# Patient Record
Sex: Female | Born: 1945
Health system: Southern US, Community
[De-identification: ages and names within clinical notes are randomized; demographics above are authoritative.]

## PROBLEM LIST (undated history)

## (undated) HISTORY — PX: BREAST LUMPECTOMY: SHX2

---

## 2015-12-21 DIAGNOSIS — H9193 Unspecified hearing loss, bilateral: Secondary | ICD-10-CM | POA: Diagnosis not present

## 2015-12-21 DIAGNOSIS — E119 Type 2 diabetes mellitus without complications: Secondary | ICD-10-CM | POA: Diagnosis not present

## 2015-12-21 DIAGNOSIS — R739 Hyperglycemia, unspecified: Secondary | ICD-10-CM | POA: Diagnosis not present

## 2015-12-21 DIAGNOSIS — J439 Emphysema, unspecified: Secondary | ICD-10-CM | POA: Diagnosis not present

## 2016-06-22 DIAGNOSIS — Z87891 Personal history of nicotine dependence: Secondary | ICD-10-CM | POA: Diagnosis not present

## 2016-06-22 DIAGNOSIS — E782 Mixed hyperlipidemia: Secondary | ICD-10-CM | POA: Diagnosis not present

## 2016-06-22 DIAGNOSIS — J418 Mixed simple and mucopurulent chronic bronchitis: Secondary | ICD-10-CM | POA: Diagnosis not present

## 2016-06-22 DIAGNOSIS — Z0000 Encounter for general adult medical examination without abnormal findings: Secondary | ICD-10-CM | POA: Diagnosis not present

## 2016-08-28 DIAGNOSIS — Z87891 Personal history of nicotine dependence: Secondary | ICD-10-CM | POA: Diagnosis not present

## 2016-08-28 DIAGNOSIS — Z122 Encounter for screening for malignant neoplasm of respiratory organs: Secondary | ICD-10-CM | POA: Diagnosis not present

## 2016-08-28 DIAGNOSIS — Z1231 Encounter for screening mammogram for malignant neoplasm of breast: Secondary | ICD-10-CM | POA: Diagnosis not present

## 2016-09-12 DIAGNOSIS — Z23 Encounter for immunization: Secondary | ICD-10-CM | POA: Diagnosis not present

## 2017-05-09 DIAGNOSIS — R000 Tachycardia, unspecified: Secondary | ICD-10-CM | POA: Diagnosis not present

## 2017-05-09 DIAGNOSIS — J441 Chronic obstructive pulmonary disease with (acute) exacerbation: Secondary | ICD-10-CM | POA: Diagnosis not present

## 2017-05-09 DIAGNOSIS — I700 Atherosclerosis of aorta: Secondary | ICD-10-CM | POA: Diagnosis not present

## 2017-05-09 DIAGNOSIS — J029 Acute pharyngitis, unspecified: Secondary | ICD-10-CM | POA: Diagnosis not present

## 2017-05-09 DIAGNOSIS — R0902 Hypoxemia: Secondary | ICD-10-CM | POA: Diagnosis not present

## 2017-05-09 DIAGNOSIS — R079 Chest pain, unspecified: Secondary | ICD-10-CM | POA: Diagnosis not present

## 2017-05-09 DIAGNOSIS — I2510 Atherosclerotic heart disease of native coronary artery without angina pectoris: Secondary | ICD-10-CM | POA: Diagnosis not present

## 2017-05-09 DIAGNOSIS — K449 Diaphragmatic hernia without obstruction or gangrene: Secondary | ICD-10-CM | POA: Diagnosis not present

## 2017-07-16 DIAGNOSIS — Z87891 Personal history of nicotine dependence: Secondary | ICD-10-CM | POA: Diagnosis not present

## 2017-07-16 DIAGNOSIS — J439 Emphysema, unspecified: Secondary | ICD-10-CM | POA: Diagnosis not present

## 2017-07-16 DIAGNOSIS — N951 Menopausal and female climacteric states: Secondary | ICD-10-CM | POA: Diagnosis not present

## 2017-07-16 DIAGNOSIS — R42 Dizziness and giddiness: Secondary | ICD-10-CM | POA: Diagnosis not present

## 2017-07-24 ENCOUNTER — Telehealth: Payer: Self-pay | Admitting: Acute Care

## 2017-07-24 DIAGNOSIS — Z122 Encounter for screening for malignant neoplasm of respiratory organs: Secondary | ICD-10-CM

## 2017-07-24 DIAGNOSIS — Z87891 Personal history of nicotine dependence: Secondary | ICD-10-CM

## 2017-07-26 NOTE — Telephone Encounter (Signed)
Will forward to lung nodule pool.  

## 2017-07-27 NOTE — Telephone Encounter (Signed)
Spoke with pt and scheduled for Alyssa Petty 09/05/17 10:00 CT ordered Pt verbalized understanding Nothing further needed

## 2017-08-07 ENCOUNTER — Other Ambulatory Visit: Payer: Self-pay | Admitting: Family Medicine

## 2017-08-07 DIAGNOSIS — Z1231 Encounter for screening mammogram for malignant neoplasm of breast: Secondary | ICD-10-CM

## 2017-08-24 DIAGNOSIS — Z23 Encounter for immunization: Secondary | ICD-10-CM | POA: Diagnosis not present

## 2017-09-04 ENCOUNTER — Ambulatory Visit
Admission: RE | Admit: 2017-09-04 | Discharge: 2017-09-04 | Disposition: A | Discharge status: Home / Self Care | Payer: Medicare Other | Source: Ambulatory Visit | Attending: Family Medicine | Admitting: Family Medicine

## 2017-09-04 DIAGNOSIS — Z1231 Encounter for screening mammogram for malignant neoplasm of breast: Secondary | ICD-10-CM

## 2017-09-05 ENCOUNTER — Encounter: Payer: Self-pay | Admitting: Acute Care

## 2017-09-05 ENCOUNTER — Ambulatory Visit (INDEPENDENT_AMBULATORY_CARE_PROVIDER_SITE_OTHER)
Admission: RE | Admit: 2017-09-05 | Discharge: 2017-09-05 | Disposition: A | Discharge status: Home / Self Care | Payer: Medicare Other | Source: Ambulatory Visit | Attending: Acute Care | Admitting: Acute Care

## 2017-09-05 ENCOUNTER — Ambulatory Visit (INDEPENDENT_AMBULATORY_CARE_PROVIDER_SITE_OTHER): Payer: Medicare Other | Admitting: Acute Care

## 2017-09-05 DIAGNOSIS — Z122 Encounter for screening for malignant neoplasm of respiratory organs: Secondary | ICD-10-CM

## 2017-09-05 DIAGNOSIS — Z87891 Personal history of nicotine dependence: Secondary | ICD-10-CM

## 2017-09-05 NOTE — Progress Notes (Signed)
Shared Decision Making Visit Lung Cancer Screening Program [phone])   Eligibility:  Age 71 y.o.  Pack Years Smoking History Calculation 114-pack-year smoking history (# packs/per year x # years smoked)  Recent History of coughing up blood  no  Unexplained weight loss? no ( >Than 15 pounds within the last 6 months )  Prior History Lung / other cancer no (Diagnosis within the last 5 years already requiring surveillance chest CT Scans).  Smoking Status Former Smoker  Former Smokers: Years since quit: 14 years  Quit Date: 2004  Visit Components:  Discussion included one or more decision making aids. yes  Discussion included risk/benefits of screening. yes  Discussion included potential follow up diagnostic testing for abnormal scans. yes  Discussion included meaning and risk of over diagnosis. yes  Discussion included meaning and risk of False Positives. yes  Discussion included meaning of total radiation exposure. yes  Counseling Included:  Importance of adherence to annual lung cancer LDCT screening. yes  Impact of comorbidities on ability to participate in the program. yes  Ability and willingness to under diagnostic treatment. yes  Smoking Cessation Counseling:  Current Smokers:   Discussed importance of smoking cessation. Not applicable patient is a former smoker  Information about tobacco cessation classes and interventions provided to patient. yes  Patient provided with "ticket" for LDCT Scan. yes  Symptomatic Patient. no  Counseling  Diagnosis Code: Tobacco Use Z72.0  Asymptomatic Patient yes  Counseling (Intermediate counseling: > three minutes counseling) [id number]  Former Smokers:   Discussed the importance of maintaining cigarette abstinence. yes  Diagnosis Code: Personal History of Nicotine Dependence. W29.562  Information about tobacco cessation classes and interventions provided to patient. Yes  Patient provided with "ticket" for LDCT  Scan. yes  Written Order for Lung Cancer Screening with LDCT placed in Epic. Yes (CT Chest Lung Cancer Screening Low Dose W/O CM) [id number] Z12.2-Screening of respiratory organs Z87.891-Personal history of nicotine dependence  I spent 25 minutes of face to face time with Alyssa Petty discussing the risks and benefits of lung cancer screening. We viewed a power point together that explained in detail the above noted topics. We took the time to pause the power point at intervals to allow for questions to be asked and answered to ensure understanding. We discussed that she had taken the single most powerful action possible to decrease her risk of developing lung cancer when she quit smoking. I counseled her to remain smoke free, and to contact me if she ever had the desire to smoke again so that I can provide resources and tools to help support the effort to remain smoke free. We discussed the time and location of the scan, and that either  Alyssa Miyamoto RN or I will call with the results within  24-48 hours of receiving them. She has my card and contact information in the event she needs to speak with me, in addition to a copy of the power point we reviewed as a resource. Alyssa Petty verbalized understanding of all of the above and had no further questions upon leaving the office.     I explained to the patient that there has been a high incidence of coronary artery disease noted on these exams. I explained that this is a non-gated exam therefore degree or severity cannot be determined. This patient is not on statin therapy. I have asked the patient to follow-up with their PCP regarding any incidental finding of coronary artery disease and management with diet or medication  as they feel is clinically indicated. The patient verbalized understanding of the above and had no further questions.     Bevelyn Ngo, NP 09/05/2017

## 2017-09-14 ENCOUNTER — Telehealth: Payer: Self-pay | Admitting: Acute Care

## 2017-09-14 DIAGNOSIS — Z87891 Personal history of nicotine dependence: Secondary | ICD-10-CM

## 2017-09-14 DIAGNOSIS — Z122 Encounter for screening for malignant neoplasm of respiratory organs: Secondary | ICD-10-CM

## 2017-09-14 NOTE — Telephone Encounter (Signed)
Pt informed of CT results per Sarah Groce, NP.  PT verbalized understanding.  Copy sent to PCP.  Order placed for 1 yr f/u CT.  

## 2017-09-24 DIAGNOSIS — Z780 Asymptomatic menopausal state: Secondary | ICD-10-CM | POA: Diagnosis not present

## 2018-01-01 DIAGNOSIS — Z0001 Encounter for general adult medical examination with abnormal findings: Secondary | ICD-10-CM | POA: Diagnosis not present

## 2018-01-01 DIAGNOSIS — R0989 Other specified symptoms and signs involving the circulatory and respiratory systems: Secondary | ICD-10-CM | POA: Diagnosis not present

## 2018-01-01 DIAGNOSIS — Z79899 Other long term (current) drug therapy: Secondary | ICD-10-CM | POA: Diagnosis not present

## 2018-01-01 DIAGNOSIS — J439 Emphysema, unspecified: Secondary | ICD-10-CM | POA: Diagnosis not present

## 2018-01-01 DIAGNOSIS — I700 Atherosclerosis of aorta: Secondary | ICD-10-CM | POA: Diagnosis not present

## 2018-08-23 ENCOUNTER — Other Ambulatory Visit: Payer: Self-pay | Admitting: Family Medicine

## 2018-08-23 DIAGNOSIS — Z1231 Encounter for screening mammogram for malignant neoplasm of breast: Secondary | ICD-10-CM

## 2018-08-27 DIAGNOSIS — H524 Presbyopia: Secondary | ICD-10-CM | POA: Diagnosis not present

## 2018-08-27 DIAGNOSIS — H353133 Nonexudative age-related macular degeneration, bilateral, advanced atrophic without subfoveal involvement: Secondary | ICD-10-CM | POA: Diagnosis not present

## 2018-09-02 DIAGNOSIS — Z23 Encounter for immunization: Secondary | ICD-10-CM | POA: Diagnosis not present

## 2018-09-16 ENCOUNTER — Ambulatory Visit (INDEPENDENT_AMBULATORY_CARE_PROVIDER_SITE_OTHER)
Admission: RE | Admit: 2018-09-16 | Discharge: 2018-09-16 | Disposition: A | Discharge status: Home / Self Care | Payer: Medicare Other | Source: Ambulatory Visit | Attending: Acute Care | Admitting: Acute Care

## 2018-09-16 DIAGNOSIS — Z87891 Personal history of nicotine dependence: Secondary | ICD-10-CM | POA: Diagnosis not present

## 2018-09-16 DIAGNOSIS — Z122 Encounter for screening for malignant neoplasm of respiratory organs: Secondary | ICD-10-CM

## 2018-09-20 NOTE — Progress Notes (Signed)
Called spoke with patient, advised of LDCT results / recs as stated by Maralyn Sago NP.  Pt verbalized understanding and denied any questions.  LDCT results faxed to PCP.  Denise to order LDCT for 09/2019.

## 2018-09-23 ENCOUNTER — Ambulatory Visit
Admission: RE | Admit: 2018-09-23 | Discharge: 2018-09-23 | Disposition: A | Discharge status: Home / Self Care | Payer: Medicare Other | Source: Ambulatory Visit | Attending: Family Medicine | Admitting: Family Medicine

## 2018-09-23 DIAGNOSIS — Z1231 Encounter for screening mammogram for malignant neoplasm of breast: Secondary | ICD-10-CM | POA: Diagnosis not present

## 2018-09-24 ENCOUNTER — Other Ambulatory Visit: Payer: Self-pay | Admitting: Acute Care

## 2018-09-24 DIAGNOSIS — Z87891 Personal history of nicotine dependence: Secondary | ICD-10-CM

## 2018-09-24 DIAGNOSIS — Z122 Encounter for screening for malignant neoplasm of respiratory organs: Secondary | ICD-10-CM

## 2019-01-03 DIAGNOSIS — I700 Atherosclerosis of aorta: Secondary | ICD-10-CM | POA: Diagnosis not present

## 2019-01-03 DIAGNOSIS — J439 Emphysema, unspecified: Secondary | ICD-10-CM | POA: Diagnosis not present

## 2019-01-03 DIAGNOSIS — Z79899 Other long term (current) drug therapy: Secondary | ICD-10-CM | POA: Diagnosis not present

## 2019-01-03 DIAGNOSIS — Z0000 Encounter for general adult medical examination without abnormal findings: Secondary | ICD-10-CM | POA: Diagnosis not present

## 2019-01-21 ENCOUNTER — Encounter: Payer: Self-pay | Admitting: Internal Medicine

## 2019-01-21 ENCOUNTER — Ambulatory Visit (INDEPENDENT_AMBULATORY_CARE_PROVIDER_SITE_OTHER): Payer: Medicare Other | Admitting: Internal Medicine

## 2019-01-21 VITALS — BP 112/70 | HR 80 | Ht 63.5 in | Wt 162.0 lb

## 2019-01-21 DIAGNOSIS — J449 Chronic obstructive pulmonary disease, unspecified: Secondary | ICD-10-CM | POA: Diagnosis not present

## 2019-01-21 LAB — BASIC METABOLIC PANEL
BUN: 21 mg/dL (ref 6–23)
CO2: 27 mEq/L (ref 19–32)
Calcium: 9.7 mg/dL (ref 8.4–10.5)
Chloride: 103 mEq/L (ref 96–112)
Creatinine, Ser: 0.82 mg/dL (ref 0.40–1.20)
GFR: 68.62 mL/min (ref >60.00)
Glucose, Bld: 103 mg/dL — ABNORMAL HIGH (ref 70–99)
Potassium: 3.8 mEq/L (ref 3.5–5.1)
Sodium: 139 mEq/L (ref 135–145)

## 2019-01-21 LAB — CBC WITH DIFFERENTIAL/PLATELET
Basophils Absolute: 0.1 10*3/uL (ref 0.0–0.1)
Basophils Relative: 1.3 % (ref 0.0–3.0)
Eosinophils Absolute: 0.2 10*3/uL (ref 0.0–0.7)
Eosinophils Relative: 2.7 % (ref 0.0–5.0)
HCT: 45.6 % (ref 36.0–46.0)
Hemoglobin: 15.4 g/dL — ABNORMAL HIGH (ref 12.0–15.0)
Lymphocytes Relative: 38 % (ref 12.0–46.0)
Lymphs Abs: 2.2 10*3/uL (ref 0.7–4.0)
MCHC: 33.8 g/dL (ref 30.0–36.0)
MCV: 96.2 fl (ref 78.0–100.0)
Monocytes Absolute: 0.6 10*3/uL (ref 0.1–1.0)
Monocytes Relative: 10.1 % (ref 3.0–12.0)
Neutro Abs: 2.8 10*3/uL (ref 1.4–7.7)
Neutrophils Relative %: 47.9 % (ref 43.0–77.0)
Platelets: 243 10*3/uL (ref 150.0–400.0)
RBC: 4.75 Mil/uL (ref 3.87–5.11)
RDW: 12.3 % (ref 11.5–15.5)
WBC: 5.9 10*3/uL (ref 4.0–10.5)

## 2019-01-21 MED ORDER — TIOTROPIUM BROMIDE-OLODATEROL 2.5-2.5 MCG/ACT IN AERS: 2.0000 | INHALATION_SPRAY | Freq: Every day | RESPIRATORY_TRACT | 0 refills | Status: DC

## 2019-01-21 MED ORDER — TIOTROPIUM BROMIDE-OLODATEROL 2.5-2.5 MCG/ACT IN AERS: 2.0000 | INHALATION_SPRAY | Freq: Every day | RESPIRATORY_TRACT | 11 refills | Status: DC

## 2019-01-21 NOTE — Patient Instructions (Signed)
Plan A = Automatic = Stiolto 2pffs each am   Work on inhaler technique:  relax and gently blow all the way out then take a nice smooth deep breath back in, triggering the inhaler at same time you start breathing in.  Blow out thru nose. Rinse and gargle with water when done     Plan B = Backup Only use your albuterol inhaler as a rescue medication to be used if you can't catch your breath by resting or doing a relaxed purse lip breathing pattern.  - The less you use it, the better it will work when you need it. - Ok to use the inhaler up to 2 puffs  every 4 hours if you must but call for appointment if use goes up over your usual need - Don't leave home without it !!  (think of it like the spare tire for your car)     Please remember to go to the lab department   for your tests - we will call you with the results when they are available.      Please schedule a follow up office visit in 2 weeks, sooner if needed with pfts on return but use your plan A first

## 2019-01-21 NOTE — Progress Notes (Signed)
Alyssa Petty, female    DOB: [birth year],   MRN: [id number]   Brief patient profile:  84 yowf quit smoking 2010 with doe x hiking with pfts dx as "emphysema" and complicated by by post op hypoxemia 2010 but never treated longterm with 02 and placed on  spiriva and then anoro x 2017 and able to walk up to 5 miles a day thru [place] but avoids hills- concerned about desats when visits fm in [place] with elevations up to 4k feet so referred to pulmonary clinic 01/21/2019 by Dr   Dorthy Cooler     History of Present Illness  01/21/2019  Pulmonary/ 1st office eval/Alyssa Petty  Chief Complaint  Patient presents with  . Pulmonary Consult    Referred by Dr Dorthy Cooler. Pt states traveled to [place] last year and had SOB and decreased o2 sats. She is concerned bc she plans to visit there again soon. She denies any respiratory co's today.   Dyspnea:  Fixed = MMRC1 = can walk nl pace, flat grade, can't hurry or go uphills or steps s sob   Cough: immediately on lying down year round   Sleep: bed is flat SABA use: can def tell it helps and does worse with activity if misses a dose   No obvious day to day or daytime variability or assoc excess/ purulent sputum or mucus plugs or hemoptysis or cp or chest tightness, subjective wheeze or overt sinus or hb symptoms.   Sleeps ok without nocturnal  or early am exacerbation  of respiratory  c/o's or need for noct saba. Also denies any obvious fluctuation of symptoms with weather or environmental changes or other aggravating or alleviating factors except as outlined above   No unusual exposure hx or h/o childhood pna/ asthma or knowledge of premature birth.  Current Allergies, Complete Past Medical History, Past Surgical History, Family History, and Social History were reviewed in [employer] record.  ROS  The following are not active complaints unless bolded Hoarseness, sore throat, dysphagia, dental problems, itching, sneezing,  nasal congestion or  discharge of excess mucus or purulent secretions, ear ache,   fever, chills, sweats, unintended wt loss or wt gain, classically pleuritic or exertional cp,  orthopnea pnd or arm/hand swelling  or leg swelling, presyncope, palpitations, abdominal pain, anorexia, nausea, vomiting, diarrhea  or change in bowel habits or change in bladder habits, change in stools or change in urine, dysuria, hematuria,  rash, arthralgias, visual complaints, headache, numbness, weakness or ataxia or problems with walking or coordination,  change in mood or  memory.             No past medical history on file.  Outpatient Medications Prior to Visit  Medication Sig Dispense Refill  . albuterol (PROVENTIL HFA;VENTOLIN HFA) 108 (90 Base) MCG/ACT inhaler Inhale 2 puffs into the lungs every 4 (four) hours as needed.    . umeclidinium-vilanterol (ANORO ELLIPTA) 62.5-25 MCG/INH AEPB Inhale 1 puff into the lungs daily.        Objective:     BP 112/70 (BP Location: Left Arm, Cuff Size: Normal)   Pulse 80   Ht 5' 3.5" (1.613 m)   Wt 162 lb (73.5 kg)   SpO2 90%   BMI 28.25 kg/m   SpO2: 90 %  RA   HEENT: nl dentition / oropharynx. Nl external ear canals without cough reflex -  Mild bilateral non-specific turbinate edema     NECK :  without JVD/Nodes/TM/ nl carotid upstrokes bilaterally  LUNGS: no acc muscle use,  Mild barrel  contour chest wall with bilateral  Distant bs s audible wheeze and  without cough on insp or exp maneuver and mild  Hyperresonant  to  percussion bilaterally     CV:  RRR  no s3 or murmur or increase in P2, and no edema   ABD:  soft and nontender with pos late  insp Hoover's  in the supine position. No bruits or organomegaly appreciated, bowel sounds nl  MS:   Nl gait/  ext warm without deformities, calf tenderness, cyanosis or clubbing No obvious joint restrictions   SKIN: warm and dry without lesions    NEURO:  alert, approp, nl sensorium with  no motor or cerebellar deficits  apparent.       I personally reviewed images and agree with radiology impression as follows:   Chest CT 09/16/18 1. Lung-RADS 2, benign appearance or behavior. Continue annual screening with low-dose chest CT without contrast in 12 months. 2. Aortic atherosclerosis (ICD10-I70.0) and emphysema (ICD10-J43.9). 3. Hepatic steatosis.   Labs ordered 01/21/2019  Alpha one AT screen   Labs ordered/ reviewed:     Chemistry      Component Value Date/Time   NA 139 01/21/2019 1502   K 3.8 01/21/2019 1502   CL 103 01/21/2019 1502   CO2 27 01/21/2019 1502   BUN 21 01/21/2019 1502   CREATININE 0.82 01/21/2019 1502      Component Value Date/Time   CALCIUM 9.7 01/21/2019 1502        Lab Results  Component Value Date   WBC 5.9 01/21/2019   HGB 15.4 (H) 01/21/2019   HCT 45.6 01/21/2019   MCV 96.2 01/21/2019   PLT 243.0 01/21/2019       EOS                                                              0.2                                     01/21/2019          Assessment   COPD  / emphysemic phenotype Quit smoking 2010  - LDSCT  09/16/18 c/w emphysema with sats only 90% at rest 2/11/2020likely related to low dlco from loss of alveolar surface area from emphysema   - alpha one AT screen 01/21/2019  - 01/21/2019  After extensive coaching inhaler device,  effectiveness = 90% so changed to stioilto trial basis due to dry cough and desats at altitudes   Pt is Group B in terms of symptom/risk and laba/lama therefore appropriate rx at this point and anoro reasonable first choice but still doe requring saba prior to exertion, desats at altitude,   and dry cough typical of dpi effects so reasonable to try smi lama/laba and return for pfts and walking sats but ultimately will likely still  need to have a POC for travel to altitudes a mile high and will look into getting her one for traval purposes after we sort out what she'll need for qualification as we have no chamber here to mimick atm pressure  at a mile high.       Total time  devoted to counseling  > 50 % of initial 45 min office visit:  review case with pt/  device teaching which extended face to face time for this visit / discussion of options/alternatives/ personally creating written customized instructions  in presence of pt  then going over those specific  Instructions directly with the pt including how to use all of the meds but in particular covering each new medication in detail and the difference between the maintenance= "automatic" meds and the prns using an action plan format for the latter (If this problem/symptom => do that organization reading Left to right).  Please see AVS from this visit for a full list of these instructions which I personally wrote for this pt and  are unique to this visit.      Christinia Gully, MD 01/21/2019

## 2019-01-22 ENCOUNTER — Encounter: Payer: Self-pay | Admitting: Internal Medicine

## 2019-01-22 NOTE — Assessment & Plan Note (Addendum)
Quit smoking 2010  - LDSCT  09/16/18 c/w emphysema with sats only 90% at rest 2/11/2020likely related to low dlco from loss of alveolar surface area from emphysema   - alpha one AT screen 01/21/2019  - 01/21/2019  After extensive coaching inhaler device,  effectiveness = 90% so changed to stioilto trial basis due to dry cough and desats at altitudes   Pt is Group B in terms of symptom/risk and laba/lama therefore appropriate rx at this point and anoro reasonable first choice but still doe requring saba prior to exertion, desats at altitude,   and dry cough typical of dpi effects so reasonable to try smi lama/laba and return for pfts and walking sats but ultimately will likely still  need to have a POC for travel to altitudes a mile high and will look into getting her one for traval purposes after we sort out what she'll need for qualification as we have no chamber here to mimick atm pressure at a mile high.    Total time devoted to counseling  > 50 % of initial 45 min office visit:  review case with pt/  device teaching which extended face to face time for this visit / discussion of options/alternatives/ personally creating written customized instructions  in presence of pt  then going over those specific  Instructions directly with the pt including how to use all of the meds but in particular covering each new medication in detail and the difference between the maintenance= "automatic" meds and the prns using an action plan format for the latter (If this problem/symptom => do that organization reading Left to right).  Please see AVS from this visit for a full list of these instructions which I personally wrote for this pt and  are unique to this visit.

## 2019-01-24 ENCOUNTER — Encounter: Payer: Self-pay | Admitting: General Surgery

## 2019-01-24 ENCOUNTER — Telehealth: Payer: Self-pay | Admitting: General Surgery

## 2019-01-24 DIAGNOSIS — J449 Chronic obstructive pulmonary disease, unspecified: Secondary | ICD-10-CM

## 2019-01-24 MED ORDER — GLYCOPYRROLATE-FORMOTEROL 9-4.8 MCG/ACT IN AERO: 2.0000 | INHALATION_SPRAY | Freq: Two times a day (BID) | RESPIRATORY_TRACT | 3 refills | Status: DC

## 2019-01-24 NOTE — Telephone Encounter (Signed)
Called and spoke with patient with MW recommendations below Pt would like Bevespi be called in to pharmacy today Placed order today to [facility] Pt verbalized understanding, no further concerns Nothing further needed.

## 2019-01-24 NOTE — Telephone Encounter (Signed)
Fax received from [facility].  Stiolto Respimat 2.5 mcg is not covered. Patient has to try and fail at least 2 medications before Stiolto will be covered. Alternatives are Bevespi or Breo Ellipta. Message sent to Dr. Sherene Sires to advise.   Dr. Sherene Sires, please see above and advise which medication can be sent in for the patient. Thank you.

## 2019-01-24 NOTE — Telephone Encounter (Signed)
Once finishes full sample of stiolto change over to bevespi 2 bid instead to see if notes any change at all in activity tolerance (very similar drug, same delivery device as her rescue inhaler

## 2019-01-28 LAB — ALPHA-1 ANTITRYPSIN PHENOTYPE: A-1 Antitrypsin, Ser: 131 mg/dL (ref 83–199)

## 2019-01-29 ENCOUNTER — Telehealth: Payer: Self-pay | Admitting: *Deleted

## 2019-01-29 NOTE — Telephone Encounter (Signed)
Spoke with pt and notified of results per Dr. Wert. Pt verbalized understanding and denied any questions. 

## 2019-01-29 NOTE — Telephone Encounter (Signed)
-----   Message from Nyoka Cowden, MD sent at 01/29/2019  5:24 AM EST ----- Let her know alpha one screen showed no deficiency so no change in recs

## 2019-02-05 ENCOUNTER — Ambulatory Visit (INDEPENDENT_AMBULATORY_CARE_PROVIDER_SITE_OTHER): Payer: Medicare Other | Admitting: Internal Medicine

## 2019-02-05 ENCOUNTER — Encounter: Payer: Self-pay | Admitting: Internal Medicine

## 2019-02-05 DIAGNOSIS — J449 Chronic obstructive pulmonary disease, unspecified: Secondary | ICD-10-CM | POA: Diagnosis not present

## 2019-02-05 DIAGNOSIS — J9611 Chronic respiratory failure with hypoxia: Secondary | ICD-10-CM | POA: Diagnosis not present

## 2019-02-05 LAB — PULMONARY FUNCTION TEST
DL/VA % pred: 61 %
DL/VA: 2.55 ml/min/mmHg/L
DLCO cor % pred: 53 %
DLCO cor: 9.96 ml/min/mmHg
DLCO unc % pred: 56 %
DLCO unc: 10.52 ml/min/mmHg
FEF 25-75 Post: 1.12 L/sec
FEF 25-75 Pre: 1 L/sec
FEF2575-%Change-Post: 12 %
FEF2575-%Pred-Post: 62 %
FEF2575-%Pred-Pre: 55 %
FEV1-%Change-Post: 3 %
FEV1-%Pred-Post: 86 %
FEV1-%Pred-Pre: 83 %
FEV1-Post: 1.85 L
FEV1-Pre: 1.79 L
FEV1FVC-%Change-Post: 0 %
FEV1FVC-%Pred-Pre: 87 %
FEV6-%Change-Post: 2 %
FEV6-%Pred-Post: 101 %
FEV6-%Pred-Pre: 98 %
FEV6-Post: 2.74 L
FEV6-Pre: 2.67 L
FEV6FVC-%Change-Post: 0 %
FEV6FVC-%Pred-Post: 104 %
FEV6FVC-%Pred-Pre: 104 %
FVC-%Change-Post: 2 %
FVC-%Pred-Post: 96 %
FVC-%Pred-Pre: 94 %
FVC-Post: 2.74 L
FVC-Pre: 2.68 L
Post FEV1/FVC ratio: 67 %
Post FEV6/FVC ratio: 100 %
Pre FEV1/FVC ratio: 67 %
Pre FEV6/FVC Ratio: 100 %
RV % pred: 99 %
RV: 2.15 L
TLC % pred: 98 %
TLC: 4.84 L

## 2019-02-05 MED ORDER — BUDESONIDE-FORMOTEROL FUMARATE 80-4.5 MCG/ACT IN AERO: 2.0000 | INHALATION_SPRAY | Freq: Two times a day (BID) | RESPIRATORY_TRACT | 0 refills | Status: DC

## 2019-02-05 NOTE — Progress Notes (Signed)
Patient completed full PFT today. 

## 2019-02-05 NOTE — Progress Notes (Signed)
Alyssa Petty, female    DOB: 08-Apr-1946,   MRN: [id number]   Brief patient profile:  61 yowf quit smoking 2004  with doe x hiking with pfts dx as "emphysema" and complicated by by post op hypoxemia 2010 but never treated longterm with 02 and placed on  spiriva and then anoro x 2017 and able to walk up to 5 miles a day [place] but [place]- concerned about desats when visits fm in [place] with elevations up to 4k feet so referred to pulmonary clinic 01/21/2019 by Dr   Docia Chuck     History of Present Illness  01/21/2019  Pulmonary/ 1st office eval/Dov Dill  Chief Complaint  Patient presents with  . Pulmonary Consult    Referred by Dr Docia Chuck. Pt states traveled to [place] last year and had SOB and decreased o2 sats. She is concerned bc she plans to visit there again soon. She denies any respiratory co's today.   Dyspnea:  Fixed = MMRC1 = can walk nl pace, flat grade, can't hurry or go uphills or steps s sob   Cough: immediately on lying down year round   Sleep: bed is flat SABA use: can def tell it helps and does worse with activity if misses a dose  rec Plan A = Automatic = Stiolto 2pffs each am  Work on inhaler technique:    Plan B = Backup Only use your albuterol inhaler as a rescue medication  Please schedule a follow up office visit in 2 weeks, sooner if needed with pfts on return but use your plan A first     02/05/2019  f/u ov/Ekansh Sherk re:  GOLD I  Improved on stiolto but insurance won't cover Chief Complaint  Patient presents with  . Follow-up    Pt had full PFT prior to ov today. Pt's cough has improved.  Dyspnea:  uphills still doe but on stiolto needed saba less  =  MMRC1 = can walk nl pace, flat grade, can't hurry or go uphills or steps s sob   Cough: at bedtime resolved  Sleeping: flat/ one pillows  SABA use: none  02: no   No obvious day to day or daytime variability or assoc excess/ purulent sputum or mucus plugs or hemoptysis or cp or chest tightness,  subjective wheeze or overt   hb symptoms.   Sleeping well  without nocturnal  or early am exacerbation  of respiratory  c/o's or need for noct saba. Also denies any obvious fluctuation of symptoms with weather or environmental changes or other aggravating or alleviating factors except as outlined above   No unusual exposure hx or h/o childhood pna/ asthma or knowledge of premature birth.  Current Allergies, Complete Past Medical History, Past Surgical History, Family History, and Social History were reviewed in [employer] record.  ROS  The following are not active complaints unless bolded Hoarseness, sore throat, dysphagia, dental problems, itching, sneezing,  nasal congestion or discharge of excess mucus or purulent secretions, ear ache,   fever, chills, sweats, unintended wt loss or wt gain, classically pleuritic or exertional cp,  orthopnea pnd or arm/hand swelling  or leg swelling, presyncope, palpitations, abdominal pain, anorexia, nausea, vomiting, diarrhea  or change in bowel habits or change in bladder habits, change in stools or change in urine, dysuria, hematuria,  rash, arthralgias, visual complaints, headache, numbness, weakness or ataxia or problems with walking or coordination,  change in mood or  memory.  Current Meds  Medication Sig  .  albuterol (PROVENTIL HFA;VENTOLIN HFA) 108 (90 Base) MCG/ACT inhaler Inhale 2 puffs into the lungs every 4 (four) hours as needed.  . Glycopyrrolate-Formoterol (BEVESPI AEROSPHERE) 9-4.8 MCG/ACT AERO Inhale 2 puffs into the lungs 2 (two) times daily.                        Objective:     Wt Readings from Last 3 Encounters:  02/05/19 161 lb (73 kg)  01/21/19 162 lb (73.5 kg)     Vital signs reviewed - Note on arrival 02 sats  90% on RA       HEENT: nl dentition / oropharynx. Nl external ear canals without cough reflex -  Mild bilateral non-specific turbinate edema     NECK :  without JVD/Nodes/TM/ nl  carotid upstrokes bilaterally   LUNGS: no acc muscle use,  Mild barrel  contour chest wall with bilateral  Distant bs s audible wheeze and  without cough on insp or exp maneuver and mild  Hyperresonant  to  percussion bilaterally     CV:  RRR  no s3 or murmur or increase in P2, and no edema   ABD:  soft and nontender with pos late insp Hoover's  in the supine position. No bruits or organomegaly appreciated, bowel sounds nl  MS:   Nl gait/  ext warm without deformities, calf tenderness, cyanosis or clubbing No obvious joint restrictions   SKIN: warm and dry without lesions    NEURO:  alert, approp, nl sensorium with  no motor or cerebellar deficits apparent.            Assessment

## 2019-02-05 NOTE — Patient Instructions (Addendum)
Change to bevespi Take 2 puffs first thing in am and then another 2 puffs about 12 hours later.  When Bevespi down to zero try symb 80 Take 2 puffs first thing in am and then another 2 puffs about 12 hours later.     Work on inhaler technique:  relax and gently blow all the way out then take a nice smooth deep breath back in, triggering the inhaler at same time you start breathing in.  Hold for up to 5 seconds if you can. Blow symbicort  out thru nose. Rinse and gargle with water when done  For allergies zyrtec 10 mg one at bedtime for itchy sneezy runny nose as needed   Please schedule a follow up office visit in 6 weeks, call sooner if needed with your drug formulary all medications /inhalers/ solutions in hand so we can verify exactly what you are taking. This includes all medications from all doctors and over the counters

## 2019-02-06 ENCOUNTER — Encounter: Payer: Self-pay | Admitting: Internal Medicine

## 2019-02-06 DIAGNOSIS — J9611 Chronic respiratory failure with hypoxia: Secondary | ICD-10-CM | POA: Insufficient documentation

## 2019-02-06 NOTE — Assessment & Plan Note (Addendum)
Onset noted - post op 2010 and with trips to [place] since -    Walked RA  2 laps @  approx 280ft each @ fast pace  stopped due to  End of study, desats to 84 s sob while on Lama/laba maint rx    Based on the new guidelines for stable copd, there is no strong indication for 02 here - she can pace herself a bit slower and observe for sats staying < 85% and then could try amb 02 if interested to see improves activity tolerance but clearly will need 02 when ex at altitudes.   Discussed in detail all the  indications, usual  risks and alternatives  relative to the benefits with patient who agrees to proceed with conservative f/u as outlined     I had an extended discussion with the patient reviewing all relevant studies completed to date and  lasting 15 to 20 minutes of a 25 minute visit    See device teaching which extended face to face time for this visit as did personally observing 02 sat study.   Each maintenance medication was reviewed in detail including emphasizing most importantly the difference between maintenance and prns and under what circumstances the prns are to be triggered using an action plan format that is not reflected in the computer generated alphabetically organized AVS which I have not found useful in most complex patients, especially with respiratory illnesses  Please see AVS for specific instructions unique to this visit that I personally wrote and verbalized to the the pt in detail and then reviewed with pt  by my nurse highlighting any  changes in therapy recommended at today's visit to their plan of care.

## 2019-02-06 NOTE — Assessment & Plan Note (Addendum)
Quit smoking 2004 - LDSCT  09/16/18 c/w emphysema  - alpha one AT screen 01/21/2019  MS  Level 131  - 01/21/2019    changed to stioilto trial basis due to dry cough and desats at altitudes and with ex  -  PFT's  02/05/2019  FEV1 1.85 (86 % ) ratio 0.67  p 3 % improvement from saba p anoro prior to study with DLCO  56/53 % corrects to 61  % for alv volume  With mod curvatre - 02/05/2019   Walked RA  2 laps @  approx 218ft each @ fast pace  stopped due to  End of study, desats to 84 s sob   - 02/05/2019  After extensive coaching inhaler device,  effectiveness =    90% with hfa so try bevespi x one month (insurance limits to one month )  then symb 160 sample  x 2 weeks and regroup@  6 weeks with formulary    She only has mild copd and apparently a very restrictive formulary but doing much better on hfa than dpi with less cough, less doe so will need to see if we can work with her/ with formulary to pick best option.   Advised:  formulary restrictions will be an ongoing challenge for the forseable future and I would be happy to pick an alternative if the pt will first  provide me a list of them -  pt  will need to return here for training for any new device that is required eg dpi vs hfa vs respimat.    In the meantime we can always provide samples so that the patient never runs out of any needed respiratory medications.

## 2019-02-12 ENCOUNTER — Other Ambulatory Visit: Payer: Self-pay | Admitting: Obstetrics and Gynecology

## 2019-02-12 ENCOUNTER — Other Ambulatory Visit (HOSPITAL_COMMUNITY)
Admission: RE | Admit: 2019-02-12 | Discharge: 2019-02-12 | Disposition: A | Discharge status: Home / Self Care | Payer: Medicare Other | Source: Ambulatory Visit | Attending: Obstetrics and Gynecology | Admitting: Obstetrics and Gynecology

## 2019-02-12 DIAGNOSIS — N898 Other specified noninflammatory disorders of vagina: Secondary | ICD-10-CM | POA: Diagnosis not present

## 2019-02-12 DIAGNOSIS — N941 Unspecified dyspareunia: Secondary | ICD-10-CM | POA: Diagnosis not present

## 2019-02-12 DIAGNOSIS — M8589 Other specified disorders of bone density and structure, multiple sites: Secondary | ICD-10-CM | POA: Diagnosis not present

## 2019-02-12 DIAGNOSIS — Z01419 Encounter for gynecological examination (general) (routine) without abnormal findings: Secondary | ICD-10-CM | POA: Diagnosis not present

## 2019-02-12 DIAGNOSIS — Z01411 Encounter for gynecological examination (general) (routine) with abnormal findings: Secondary | ICD-10-CM | POA: Diagnosis not present

## 2019-02-12 DIAGNOSIS — Z124 Encounter for screening for malignant neoplasm of cervix: Secondary | ICD-10-CM | POA: Diagnosis not present

## 2019-02-14 LAB — CYTOLOGY - PAP
Diagnosis: NEGATIVE
HPV: NOT DETECTED

## 2019-03-24 ENCOUNTER — Ambulatory Visit (INDEPENDENT_AMBULATORY_CARE_PROVIDER_SITE_OTHER): Payer: Medicare Other | Admitting: Primary Care

## 2019-03-24 ENCOUNTER — Other Ambulatory Visit: Payer: Self-pay

## 2019-03-24 ENCOUNTER — Ambulatory Visit: Payer: Medicare Other | Admitting: Internal Medicine

## 2019-03-24 ENCOUNTER — Encounter: Payer: Self-pay | Admitting: Primary Care

## 2019-03-24 ENCOUNTER — Telehealth: Payer: Self-pay

## 2019-03-24 DIAGNOSIS — J449 Chronic obstructive pulmonary disease, unspecified: Secondary | ICD-10-CM | POA: Diagnosis not present

## 2019-03-24 MED ORDER — TIOTROPIUM BROMIDE MONOHYDRATE 1.25 MCG/ACT IN AERS: 2.0000 | INHALATION_SPRAY | Freq: Every day | RESPIRATORY_TRACT | 2 refills | Status: DC

## 2019-03-24 NOTE — Progress Notes (Signed)
Virtual Visit via Telephone Note  I connected with Alyssa Petty on 03/24/19 at  9:00 AM EDT by telephone and verified that I am speaking with the correct person using two identifiers.   I discussed the limitations, risks, security and privacy concerns of performing an evaluation and management service by telephone and the availability of in person appointments. I also discussed with the patient that there may be a patient responsible charge related to this service. The patient expressed understanding and agreed to proceed.   History of Present Illness: 73 year old female, former smoker quit in 2010 (126 pack year hx).  Patient of Dr. Sherene Sires, last seen on 02/05/19. Inhaler changed to Bevespi 2 puffs twice daily. Follows by low cancer screening program, last LDCT showed lung RADS2. Next LDCT due in Oct 2020.  She was originally on Anoro but reported a lot of coughing. She was given sample of Stiolto which worked extremely well for her but was not covered on her [employer]. States that while on Stiolto she had less coughing at night and that it was easier for her to breath while walking. She then tried [name] which she reports did not work well for her at all.  03/24/2019 Patient is doing well, no acute complaints. Currently taking Symbicort which she states is better than bevespi and Anoro but not as good as Stiolto. Reports rare use of her rescue inhaler. Cough has improved, still gets short winded with activity. Formulary on her plan include Breo, Advair hfa, combivent, trelegy.   Observations/Objective:  - No shortness of breath, cough or wheeze observed over the phone  Assessment and Plan:  COPD/emphysemic phenotype: - Continue Symbicort 80 2 bid - Add Spiriva respimat 1.25 mcg/act (if not covered could consider prn combivent)  Chronic respiratory failure with hypoxia:  - Previous O2 sats 90% RA at rest, likely related to low DLCO from emphysema  - 02/05/19 Walked RA  2 laps @   approx 250ft each @ fast pace  stopped due to  End of study, desats to 84 s sob while on Lama/laba maint rx   Pulmonary nodule: - Former smoker, 126 pack year hx - Following with low dose lung cancer program - LDCT 09/2018 showed lung RADS2 - Needs annual LDCT in Oct 2020   Follow Up Instructions:   2-3 months with Dr. Sherene Sires  I discussed the assessment and treatment plan with the patient. The patient was provided an opportunity to ask questions and all were answered. The patient agreed with the plan and demonstrated an understanding of the instructions.   The patient was advised to call back or seek an in-person evaluation if the symptoms worsen or if the condition fails to improve as anticipated.  I provided 25 minutes of non-face-to-face time during this encounter.   Glenford Bayley, NP

## 2019-03-24 NOTE — Telephone Encounter (Signed)
Started a PA for [employer].  Key on cover my meds is [id number].

## 2019-03-25 MED ORDER — TIOTROPIUM BROMIDE-OLODATEROL 2.5-2.5 MCG/ACT IN AERS: 2.0000 | INHALATION_SPRAY | Freq: Every day | RESPIRATORY_TRACT | 3 refills | Status: DC

## 2019-03-25 NOTE — Telephone Encounter (Signed)
Pt stated insurance called her today and stated stiolto will be covered as tier 4.  Pt stated she was ok paying this amount for the med.  Sent script to [facility] pharmacy per pt request.  Nothing further is needed.

## 2019-03-25 NOTE — Telephone Encounter (Signed)
Received a fax from [facility] stating that patient does not have RX benefits with a [employer]BS[employer] commerial line of business. Reviewed patient's chart to see if she had any other insurance, BCBS is all that she has.   [place] for PA is: [id number]. Request was sent via cmm. Insurance will fax [place] a determination within the next 2-4 business days.

## 2019-03-26 NOTE — Progress Notes (Signed)
Chart and office note reviewed in detail  > agree with a/p as outlined    

## 2019-08-12 ENCOUNTER — Telehealth: Payer: Self-pay | Admitting: Internal Medicine

## 2019-08-12 NOTE — Telephone Encounter (Signed)
Returned call to patient. She says her pcp will not be offering high dose flu shots this year. Made aware that we do have high dose. Also reminded patient that she is overdue for f/u with MW. She will call back to schedule Nothing further needed.

## 2019-08-14 ENCOUNTER — Encounter: Payer: Self-pay | Admitting: Internal Medicine

## 2019-08-14 ENCOUNTER — Other Ambulatory Visit: Payer: Self-pay

## 2019-08-14 ENCOUNTER — Ambulatory Visit (INDEPENDENT_AMBULATORY_CARE_PROVIDER_SITE_OTHER): Payer: Medicare Other | Admitting: Internal Medicine

## 2019-08-14 DIAGNOSIS — J9611 Chronic respiratory failure with hypoxia: Secondary | ICD-10-CM

## 2019-08-14 DIAGNOSIS — Z23 Encounter for immunization: Secondary | ICD-10-CM | POA: Diagnosis not present

## 2019-08-14 DIAGNOSIS — J449 Chronic obstructive pulmonary disease, unspecified: Secondary | ICD-10-CM | POA: Diagnosis not present

## 2019-08-14 DIAGNOSIS — K589 Irritable bowel syndrome without diarrhea: Secondary | ICD-10-CM | POA: Diagnosis not present

## 2019-08-14 NOTE — Assessment & Plan Note (Signed)
Onset age 73's   Classic subdiaphragmatic pain pattern suggests ibs:  Stereotypical, migratory with a very limited distribution of pain locations, daytime, not usually exacerbated by exercise  or coughing, worse in sitting position, frequently associated with generalized abd bloating, not as likely to be present supine due to the dome effect of the diaphragm which  is  canceled in that position. Frequently these patients have had multiple negative GI workups and CT scans.  Treatment consists of avoiding foods that cause gas (especially boiled eggs, mexcican food but especially  beans and undercooked vegetables like  spinach and some salads)  and citrucel 1 heaping tsp twice daily with a large glass of water.  Pain should improve w/in 2 weeks and if not then consider further GI work up prn

## 2019-08-14 NOTE — Patient Instructions (Addendum)
Track your 02 level at peak exercise intensity with goal of keeping above 90%   Ok to use ventolin 2 pffs x 15 min before exertion on alternate days to see if it makes a real difference   Classic subdiaphragmatic pain pattern suggests ibs:  Stereotypical, migratory with a very limited distribution of pain locations, daytime, not usually exacerbated by exercise  or coughing, worse in sitting position, frequently associated with generalized abd bloating, not as likely to be present supine due to the dome effect of the diaphragm which  is  canceled in that position. Frequently these patients have had multiple negative GI workups and CT scans.  Treatment consists of avoiding foods that cause gas (especially boiled eggs, mexcican food but especially  beans and undercooked vegetables like  spinach and some salads)  and citrucel 1 heaping tsp twice daily with a large glass of water.  Pain should improve w/in 2 weeks and if not then consider further GI work up.      Keep your February 2021 appt if you can.

## 2019-08-14 NOTE — Assessment & Plan Note (Signed)
Quit smoking 2004 - LDSCT  09/16/18 c/w emphysema  - alpha one AT screen 01/21/2019  MS  Level 131  - 01/21/2019    changed to stioilto trial basis due to dry cough and desats at altitudes and with ex  -  PFT's  02/05/2019  FEV1 1.85 (86 % ) ratio 0.67  p 3 % improvement from saba p anoro prior to study with DLCO  56/53 % corrects to 61  % for alv volume  With mod curvatre - 02/05/2019   Walked RA  2 laps @  approx 259ft each @ fast pace  stopped due to  End of study, desats to 84 s sob   - 02/05/2019  After extensive coaching inhaler device,  effectiveness =    90% with hfa so try bevespi x one month (insurance limits to one month )  then symb 160 sample  x 2 weeks and regroup@  6 weeks with formulary  03/24/2019 E-vist - Currently on Symbicort 80 which she states is better than bevespi and Anoro but not as good as Stiolto. Trial Spiriva respimat   > preferred stiolto     Pt is Group B in terms of symptom/risk and laba/lama therefore appropriate rx at this point >>>  Continue stiolto > she is picking her insurance company next year based on which one will cover this   Advised:  formulary restrictions will be an ongoing challenge for the forseable future and I would be happy to pick an alternative if the pt will first  provide me a list of them -  pt  will need to return here for training for any new device that is required eg dpi vs hfa vs respimat.    In the meantime we can always provide samples so that the patient never runs out of any needed respiratory medications.

## 2019-08-14 NOTE — Assessment & Plan Note (Addendum)
Onset noted - post op 2010 and with trips to [place] since -   Walked RA  2 laps @  approx 257ft each @ fast pace  stopped due to  End of study, desats to 84 s sob while on Lama/laba maint rx  - 08/14/2019   Walked RA  3 laps @  approx 22ft each @ mod pace  stopped due to  End of study, sats till 95%    No evidence of 02 desats, no need for 02    >>> keep up the walking, f/u in 6 m   I had an extended discussion with the patient  reviewing all relevant studies completed to date and  lasting 15 to 20 minutes of a 25 minute visit  which included directly observing ambulatory 02 saturation study documented in a/p section of  today's  office note.  Each maintenance medication was reviewed in detail including most importantly the difference between maintenance and prns and under what circumstances the prns are to be triggered using an action plan format that is not reflected in the computer generated alphabetically organized AVS.     Please see AVS for specific instructions unique to this visit that I personally wrote and verbalized to the the pt in detail and then reviewed with pt  by my nurse highlighting any changes in therapy recommended at today's visit .

## 2019-08-14 NOTE — Progress Notes (Signed)
Alyssa Petty, female    DOB: 29-Jan-1946,   MRN: [id number]   Brief patient profile:  47 yowf quit smoking 2004  with doe x hiking with pfts dx as "emphysema" and complicated by by post op hypoxemia 2010 but never treated longterm with 02 and placed on  spiriva and then anoro x 2017 and able to walk up to 5 miles a day [place] but avoids hills- concerned about desats when visits fm in [place] with elevations up to 4k feet so referred to pulmonary clinic 01/21/2019 by Dr   Docia Chuck     History of Present Illness  01/21/2019  Pulmonary/ 1st office eval/Geza Beranek  Chief Complaint  Patient presents with  . Pulmonary Consult    Referred by Dr Docia Chuck. Pt states traveled to [place] last year and had SOB and decreased o2 sats. She is concerned bc she plans to visit there again soon. She denies any respiratory co's today.   Dyspnea:  Fixed = MMRC1 = can walk nl pace, flat grade, can't hurry or go uphills or steps s sob   Cough: immediately on lying down year round   Sleep: bed is flat SABA use: can def tell it helps and does worse with activity if misses a dose  rec Plan A = Automatic = Stiolto 2pffs each am  Work on inhaler technique:    Plan B = Backup Only use your albuterol inhaler as a rescue medication  Please schedule a follow up office visit in 2 weeks, sooner if needed with pfts on return but use your plan A first     02/05/2019  f/u ov/Halyn Flaugher re:  GOLD I  Improved on stiolto but insurance won't cover Chief Complaint  Patient presents with  . Follow-up    Pt had full PFT prior to ov today. Pt's cough has improved.  Dyspnea:  uphills still doe but on stiolto needed saba less  =  MMRC1 = can walk nl pace, flat grade, can't hurry or go uphills or steps s sob   Cough: at bedtime resolved  Sleeping: flat/ one pillows  SABA use: none  02: no rec Change to bevespi Take 2 puffs first thing in am and then another 2 puffs about 12 hours later. When Bevespi down to zero try symb 80 Take  2 puffs first thing in am and then another 2 puffs about 12 hours later.  Work on inhaler technique:  For allergies zyrtec 10 mg one at bedtime for itchy sneezy runny nose as needed Please schedule a follow up office visit in 6 weeks, call sooner if needed with your drug formulary all medications /inhalers/ solutions in hand so we can verify exactly what you are taking. This includes all medications from all doctors and over the counters    08/14/2019  f/u ov/[name] re:  GOLD I  On stiolto Chief Complaint  Patient presents with  . Follow-up    Breathing is overall doing well. She only uses her albuterol prior to exercise. She has had some pain in her left rib cage recently.    Dyspnea:  MMRC2 = can't walk a nl pace on a flat grade s sob but does fine slow and flat x 75 min s stopping  Cough: with fresh grass  Sleeping: able to lie flat/ one pillow SABA use: ventolin some  02: none  R and L lateral rib cage pain x 30 years can last for a few min to an hour, then 100% gone,  less likely when lying down x once a month not pleuritic or ex or assoc with any GI symptoms but ? Worse with more raw veggies?    No obvious day to day or daytime variability or assoc excess/ purulent sputum or mucus plugs or hemoptysis  or chest tightness, subjective wheeze or overt sinus or hb symptoms.   Sleeping  without nocturnal  or early am exacerbation  of respiratory  c/o's or need for noct saba. Also denies any obvious fluctuation of symptoms with weather or environmental changes or other aggravating or alleviating factors except as outlined above   No unusual exposure hx or h/o childhood pna/ asthma or knowledge of premature birth.  Current Allergies, Complete Past Medical History, Past Surgical History, Family History, and Social History were reviewed in [facility] electronic medical record.  ROS  The following are not active complaints unless bolded Hoarseness, sore throat, dysphagia, dental problems,  itching, sneezing,  nasal congestion or discharge of excess mucus or purulent secretions, ear ache,   fever, chills, sweats, unintended wt loss or wt gain, classically pleuritic or exertional cp,  orthopnea pnd or arm/hand swelling  or leg swelling, presyncope, palpitations, abdominal pain, anorexia, nausea, vomiting, diarrhea  or change in bowel habits or change in bladder habits, change in stools or change in urine, dysuria, hematuria,  rash, arthralgias, visual complaints, headache, numbness, weakness or ataxia or problems with walking or coordination,  change in mood or  memory.        Current Meds  Medication Sig  . albuterol (PROVENTIL HFA;VENTOLIN HFA) 108 (90 Base) MCG/ACT inhaler Inhale 2 puffs into the lungs every 4 (four) hours as needed.  Marland Kitchen. Apoaequorin (PREVAGEN PO) Take 1 capsule by mouth daily.  . Multiple Vitamins-Minerals (PRESERVISION AREDS 2+MULTI VIT PO) Take 1 tablet by mouth daily.  . Tiotropium Bromide-Olodaterol (STIOLTO RESPIMAT) 2.5-2.5 MCG/ACT AERS Inhale 2 puffs into the lungs daily.  Marland Kitchen. VITAMIN D PO Take 1 tablet by mouth daily.                                 Objective:     Wt Readings from Last 3 Encounters:  08/14/19 157 lb (71.2 kg)  02/05/19 161 lb (73 kg)  01/21/19 162 lb (73.5 kg)     Vital signs reviewed - Note on arrival 02 sats  90% on RA           HEENT : pt wearing mask not removed for exam due to covid - 19 concerns.      NECK :  without JVD/Nodes/TM/ nl carotid upstrokes bilaterally   LUNGS: no acc muscle use,  Mild barrel  contour chest wall with bilateral  Distant bs s audible wheeze and  without cough on insp or exp maneuvers  and mild  Hyperresonant  to  percussion bilaterally     CV:  RRR  no s3 or murmur or increase in P2, and no edema   ABD:  soft and nontender with pos end  insp Hoover's  in the supine position. No bruits or organomegaly appreciated, bowel sounds nl  MS:   Nl gait/  ext warm without deformities,  calf tenderness, cyanosis or clubbing No obvious joint restrictions   SKIN: warm and dry without lesions    NEURO:  alert, approp, nl sensorium with  no motor or cerebellar deficits apparent.  Assessment

## 2019-08-19 ENCOUNTER — Other Ambulatory Visit: Payer: Self-pay | Admitting: Family Medicine

## 2019-08-19 ENCOUNTER — Telehealth: Payer: Self-pay | Admitting: Internal Medicine

## 2019-08-19 DIAGNOSIS — Z1231 Encounter for screening mammogram for malignant neoplasm of breast: Secondary | ICD-10-CM

## 2019-08-19 MED ORDER — ALBUTEROL SULFATE HFA 108 (90 BASE) MCG/ACT IN AERS: 1.0000 | INHALATION_SPRAY | RESPIRATORY_TRACT | 2 refills | Status: AC | PRN

## 2019-08-19 NOTE — Telephone Encounter (Signed)
ATC pt, line went to voicemail. LMTCB x1.  According to [place] notes from 08/14/2019, MW suggested pt use her ventolin inhaler: "Ok to use ventolin 2 pffs x 15 min before exertion on alternate days to see if it makes a real difference"; however, pt records show MW has not actually ever prescribed the medication for her (see medication list). Pt is now requesting a refill of this medication.   MW, please advise you would be ok with refilling pt's ventolin inhaler. Thank you.

## 2019-08-19 NOTE — Telephone Encounter (Signed)
PT RETURNED CALL I LET HER KNOW THAT MW IS GOING TO REFILL SCRIPT FOR 2 REFILLS AND THAT SHE WILL NEED AN APPOINT BEFORE HE WILL REFILL ANY MORE PT WAS OK W/THAT AND SHE HAS A APPOINT ALREADY SCHED FOR A ROV.Alyssa Petty

## 2019-08-19 NOTE — Telephone Encounter (Signed)
Spoke with patient. Advised her that I would go ahead and send in the refills for her. Nothing further needed at time of call.

## 2019-08-19 NOTE — Telephone Encounter (Signed)
Yes ok to fill with 2 refills then we need to see her before more if not already scheduled to do so

## 2019-10-02 ENCOUNTER — Other Ambulatory Visit: Payer: Self-pay

## 2019-10-02 ENCOUNTER — Ambulatory Visit
Admission: RE | Admit: 2019-10-02 | Discharge: 2019-10-02 | Disposition: A | Discharge status: Home / Self Care | Payer: Medicare Other | Source: Ambulatory Visit | Attending: Family Medicine | Admitting: Family Medicine

## 2019-10-02 DIAGNOSIS — Z1231 Encounter for screening mammogram for malignant neoplasm of breast: Secondary | ICD-10-CM | POA: Diagnosis not present

## 2019-10-03 ENCOUNTER — Ambulatory Visit: Payer: Medicare Other

## 2019-10-03 ENCOUNTER — Other Ambulatory Visit: Payer: Self-pay

## 2019-10-03 NOTE — Addendum Note (Signed)
Addended by: Amado Coe on: 10/03/2019 05:02 PM   Modules accepted: Orders

## 2019-10-06 ENCOUNTER — Other Ambulatory Visit: Payer: Self-pay | Admitting: *Deleted

## 2019-10-06 DIAGNOSIS — Z122 Encounter for screening for malignant neoplasm of respiratory organs: Secondary | ICD-10-CM

## 2019-10-06 DIAGNOSIS — Z87891 Personal history of nicotine dependence: Secondary | ICD-10-CM

## 2019-10-07 DIAGNOSIS — H353133 Nonexudative age-related macular degeneration, bilateral, advanced atrophic without subfoveal involvement: Secondary | ICD-10-CM | POA: Diagnosis not present

## 2019-10-07 DIAGNOSIS — H5203 Hypermetropia, bilateral: Secondary | ICD-10-CM | POA: Diagnosis not present

## 2019-10-10 ENCOUNTER — Inpatient Hospital Stay: Admission: RE | Admit: 2019-10-10 | Payer: Medicare Other | Source: Ambulatory Visit

## 2019-10-24 ENCOUNTER — Ambulatory Visit
Admission: RE | Admit: 2019-10-24 | Discharge: 2019-10-24 | Disposition: A | Discharge status: Home / Self Care | Payer: Medicare Other | Source: Ambulatory Visit | Attending: Acute Care | Admitting: Acute Care

## 2019-10-24 DIAGNOSIS — Z87891 Personal history of nicotine dependence: Secondary | ICD-10-CM

## 2019-10-24 DIAGNOSIS — Z122 Encounter for screening for malignant neoplasm of respiratory organs: Secondary | ICD-10-CM

## 2020-01-13 DIAGNOSIS — Z1159 Encounter for screening for other viral diseases: Secondary | ICD-10-CM | POA: Diagnosis not present

## 2020-01-13 DIAGNOSIS — Z0000 Encounter for general adult medical examination without abnormal findings: Secondary | ICD-10-CM | POA: Diagnosis not present

## 2020-01-13 DIAGNOSIS — Z79899 Other long term (current) drug therapy: Secondary | ICD-10-CM | POA: Diagnosis not present

## 2020-01-13 DIAGNOSIS — I700 Atherosclerosis of aorta: Secondary | ICD-10-CM | POA: Diagnosis not present

## 2020-01-13 DIAGNOSIS — E559 Vitamin D deficiency, unspecified: Secondary | ICD-10-CM | POA: Diagnosis not present

## 2020-01-13 DIAGNOSIS — J439 Emphysema, unspecified: Secondary | ICD-10-CM | POA: Diagnosis not present

## 2020-01-28 DIAGNOSIS — Z1159 Encounter for screening for other viral diseases: Secondary | ICD-10-CM | POA: Diagnosis not present

## 2020-01-28 DIAGNOSIS — Z79899 Other long term (current) drug therapy: Secondary | ICD-10-CM | POA: Diagnosis not present

## 2020-01-28 DIAGNOSIS — I700 Atherosclerosis of aorta: Secondary | ICD-10-CM | POA: Diagnosis not present

## 2020-01-28 DIAGNOSIS — E559 Vitamin D deficiency, unspecified: Secondary | ICD-10-CM | POA: Diagnosis not present

## 2020-02-09 ENCOUNTER — Encounter: Payer: Self-pay | Admitting: Internal Medicine

## 2020-02-09 ENCOUNTER — Ambulatory Visit (INDEPENDENT_AMBULATORY_CARE_PROVIDER_SITE_OTHER): Payer: Medicare Other | Admitting: Internal Medicine

## 2020-02-09 ENCOUNTER — Other Ambulatory Visit: Payer: Self-pay

## 2020-02-09 DIAGNOSIS — J449 Chronic obstructive pulmonary disease, unspecified: Secondary | ICD-10-CM

## 2020-02-09 DIAGNOSIS — J9611 Chronic respiratory failure with hypoxia: Secondary | ICD-10-CM | POA: Diagnosis not present

## 2020-02-09 MED ORDER — STIOLTO RESPIMAT 2.5-2.5 MCG/ACT IN AERS: 2.0000 | INHALATION_SPRAY | Freq: Every day | RESPIRATORY_TRACT | 0 refills | Status: DC

## 2020-02-09 NOTE — Patient Instructions (Signed)
No change in medications   Please schedule a follow up visit in 12  months but call sooner if needed  

## 2020-02-09 NOTE — Progress Notes (Signed)
Alyssa Petty, female    DOB: 12-Mar-1946,   MRN: [id number]   Brief patient profile:  73 yowf quit smoking 2004  with doe x hiking with pfts dx as "emphysema" and complicated by by post op hypoxemia 2010 but never treated longterm with 02 and placed on  spiriva and then anoro x 2017 and able to walk up to 5 [place] but [place]- concerned about desats when visits fm in [place] with elevations up to 4k feet so referred to pulmonary clinic 01/21/2019 by Dr   Alyssa Petty     History of Present Illness  01/21/2019  Pulmonary/ 1st office eval/Alyssa Petty  Chief Complaint  Patient presents with  . Pulmonary Consult    Referred by Dr Alyssa Petty. Pt states traveled to [place] last year and had SOB and decreased o2 sats. She is concerned bc she plans to visit there again soon. She denies any respiratory co's today.   Dyspnea:  Fixed = MMRC1 = can walk nl pace, flat grade, can't hurry or go uphills or steps s sob   Cough: immediately on lying down year round   Sleep: bed is flat SABA use: can def tell it helps and does worse with activity if misses a dose  rec Plan A = Automatic = Stiolto 2pffs each am  Work on inhaler technique:    Plan B = Backup Only use your albuterol inhaler as a rescue medication  Please schedule a follow up office visit in 2 weeks, sooner if needed with pfts on return but use your plan A first     02/05/2019  f/u ov/Alyssa Petty re:  GOLD I  Improved on stiolto but insurance won't cover Chief Complaint  Patient presents with  . Follow-up    Pt had full PFT prior to ov today. Pt's cough has improved.  Dyspnea:  uphills still doe but on stiolto needed saba less  =  MMRC1 = can walk nl pace, flat grade, can't hurry or go uphills or steps s sob   Cough: at bedtime resolved  Sleeping: flat/ one pillows  SABA use: none  02: no rec Change to bevespi Take 2 puffs first thing in am and then another 2 puffs about 12 hours later. When Bevespi down to zero try symb 80 Take  2 puffs first thing in am and then another 2 puffs about 12 hours later.  Work on inhaler technique:  For allergies zyrtec 10 mg one at bedtime for itchy sneezy runny nose as needed Please schedule a follow up office visit in 6 weeks, call sooner if needed with your drug formulary all medications /inhalers/ solutions in hand so we can verify exactly what you are taking. This includes all medications from all doctors and over the counters    08/14/2019  f/u ov/Alyssa Petty re:  GOLD I  On stiolto Chief Complaint  Patient presents with  . Follow-up    Breathing is overall doing well. She only uses her albuterol prior to exercise. She has had some pain in her left rib cage recently.    Dyspnea:  MMRC2 = can't walk a nl pace on a flat grade s sob but does fine slow and flat x 75 min s stopping  Cough: with fresh grass  Sleeping: able to lie flat/ one pillow SABA use: ventolin some  02: none  R and L lateral rib cage pain x 30 years can last for a few min to an hour, then 100% gone,  less likely when lying down x once a month not pleuritic or ex or assoc with any GI symptoms but ? Worse with more raw veggies?  rec Track your 02 level at peak exercise intensity with goal of keeping above 90%  Ok to use ventolin 2 pffs x 15 min before exertion on alternate days to see if it makes a real difference  Classic subdiaphragmatic pain pattern suggests ibs:  Stereotypical, migratory with a very limited distribution of pain locations, daytime, not usually exacerbated by exercise  or coughing, worse in sitting position, frequently associated with generalized abd bloating, not as likely to be present supine due to the dome effect of the diaphragm which  is  canceled in that position. Frequently these patients have had multiple negative GI workups and CT scans. Treatment consists of avoiding foods that cause gas (especially boiled eggs, mexcican food but especially  beans and undercooked vegetables like  spinach and some  salads)  and citrucel 1 heaping tsp twice daily with a large glass of water.  Pain should improve w/in 2 weeks and if not then consider further GI work up.    Keep your February 2021 appt if you can.   02/09/2020  f/u ov/Alyssa Petty re: GOLD I  stiolto / already got 2nd covid 28  Chief Complaint  Patient presents with  . Follow-up    Breathing is doing well and she rarely uses her albuterol inhaler.   Dyspnea:  MMRC2 = can't walk a nl pace on a flat grade s sob but does fine slow and flat  Cough: not much  Sleeping: prone  SABA use: none 02: none - checks at  Half way point ok    No obvious day to day or daytime variability or assoc excess/ purulent sputum or mucus plugs or hemoptysis or cp or chest tightness, subjective wheeze or overt sinus or hb symptoms.   Sleep  without nocturnal  or early am exacerbation  of respiratory  c/o's or need for noct saba. Also denies any obvious fluctuation of symptoms with weather or environmental changes or other aggravating or alleviating factors except as outlined above   No unusual exposure hx or h/o childhood pna/ asthma or knowledge of premature birth.  Current Allergies, Complete Past Medical History, Past Surgical History, Family History, and Social History were reviewed in [employer] record.  ROS  The following are not active complaints unless bolded Hoarseness, sore throat, dysphagia, dental problems, itching, sneezing,  nasal congestion or discharge of excess mucus or purulent secretions, ear ache,   fever, chills, sweats, unintended wt loss or wt gain, classically pleuritic or exertional cp,  orthopnea pnd or arm/hand swelling  or leg swelling, presyncope, palpitations, abdominal pain, anorexia, nausea, vomiting, diarrhea  or change in bowel habits or change in bladder habits, change in stools or change in urine, dysuria, hematuria,  rash, arthralgias, visual complaints, headache, numbness, weakness or ataxia or problems with  walking or coordination,  change in mood or  memory.        Current Meds  Medication Sig  . albuterol (VENTOLIN HFA) 108 (90 Base) MCG/ACT inhaler Inhale 1-2 puffs into the lungs every 4 (four) hours as needed.  Marland Kitchen Apoaequorin (PREVAGEN PO) Take 1 capsule by mouth daily.  . Multiple Vitamins-Minerals (PRESERVISION AREDS 2+MULTI VIT PO) Take 1 tablet by mouth daily.  . Tiotropium Bromide-Olodaterol (STIOLTO RESPIMAT) 2.5-2.5 MCG/ACT AERS Inhale 2 puffs into the lungs daily.  Objective:     02/09/2020          163   08/14/19 157 lb (71.2 kg)  02/05/19 161 lb (73 kg)  01/21/19 162 lb (73.5 kg)    amb wf nad  Vital signs reviewed  02/09/2020  - Note at rest 02 sats  93% on RA      HEENT : pt wearing mask not removed for exam due to covid - 19 concerns.    NECK :  without JVD/Nodes/TM/ nl carotid upstrokes bilaterally   LUNGS: no acc muscle use,  Mild barrel  contour chest wall with bilateral  Distant bs s audible wheeze and  without cough on insp or exp maneuvers  and mild  Hyperresonant  to  percussion bilaterally     CV:  RRR  no s3 or murmur or increase in P2, and no edema   ABD:  soft and nontender with pos end  insp Hoover's  in the supine position. No bruits or organomegaly appreciated, bowel sounds nl  MS:   Nl gait/  ext warm without deformities, calf tenderness, cyanosis or clubbing No obvious joint restrictions   SKIN: warm and dry without lesions    NEURO:  alert, approp, nl sensorium with  no motor or cerebellar deficits apparent.          Assessment

## 2020-02-10 ENCOUNTER — Encounter: Payer: Self-pay | Admitting: Internal Medicine

## 2020-02-10 NOTE — Assessment & Plan Note (Signed)
Onset noted - post op 2010 and with trips to [place] since -   Walked RA  2 laps @  approx 232ft each @ fast pace  stopped due to  End of study, desats to 84 s sob while on Lama/laba maint rx  - 08/14/2019   Walked RA  3 laps @  approx 240ft each @ mod pace  stopped due to  End of study, sats till 95%    Advised: Make sure you check your oxygen saturations at highest level of activity to be sure it stays over 90% and adjust upward to maintain this level if needed but remember to turn it back to previous settings when you stop (to conserve your supply).           Each maintenance medication was reviewed in detail including emphasizing most importantly the difference between maintenance and prns and under what circumstances the prns are to be triggered using an action plan format where appropriate.  Total time for H and P, chart review, counseling, teaching device and generating customized AVS unique to this office visit / charting = 30 min

## 2020-02-10 NOTE — Assessment & Plan Note (Signed)
Quit smoking 2004 - [facility]  09/16/18 c/w emphysema  - alpha one AT screen 01/21/2019  MS  Level 131  - 01/21/2019    changed to stioilto trial basis due to dry cough and desats at altitudes and with ex  -  PFT's  02/05/2019  FEV1 1.85 (86 % ) ratio 0.67  p 3 % improvement from saba p anoro prior to study with DLCO  56/53 % corrects to 61  % for alv volume  With mod curvatre - 02/05/2019   Walked RA  2 laps @  approx 280ft each @ fast pace  stopped due to  End of study, desats to 84 s sob   - 02/05/2019  After extensive coaching inhaler device,  effectiveness =    90% with hfa so try bevespi x one month (insurance limits to one month )  then symb 160 sample  x 2 weeks and regroup@  6 weeks with formulary  03/24/2019 E-vist - Currently on Symbicort 80 which she states is better than bevespi and Anoro but not as good as Stiolto. Trial Spiriva respimat   > preferred stiolto   - 02/09/2020  After extensive coaching inhaler device,  effectiveness =    90% SMI   Pt is Group B in terms of symptom/risk and laba/lama therefore appropriate rx at this point >>>  Continue stiolto/ f/u yearly unless PCP ok with refills.  F/u yearly

## 2020-04-09 ENCOUNTER — Telehealth: Payer: Self-pay | Admitting: Internal Medicine

## 2020-04-09 MED ORDER — STIOLTO RESPIMAT 2.5-2.5 MCG/ACT IN AERS: 2.0000 | INHALATION_SPRAY | Freq: Every day | RESPIRATORY_TRACT | 5 refills | Status: DC

## 2020-04-09 NOTE — Telephone Encounter (Signed)
Spoke with pt. She is needing a refill on Stiolto. Rx has been sent in. Nothing further was needed.

## 2020-09-07 DIAGNOSIS — Z23 Encounter for immunization: Secondary | ICD-10-CM | POA: Diagnosis not present

## 2020-09-23 DIAGNOSIS — Z23 Encounter for immunization: Secondary | ICD-10-CM | POA: Diagnosis not present

## 2020-10-10 ENCOUNTER — Other Ambulatory Visit: Payer: Self-pay | Admitting: Internal Medicine

## 2020-10-11 DIAGNOSIS — H5201 Hypermetropia, right eye: Secondary | ICD-10-CM | POA: Diagnosis not present

## 2020-10-11 DIAGNOSIS — H2513 Age-related nuclear cataract, bilateral: Secondary | ICD-10-CM | POA: Diagnosis not present

## 2020-10-11 DIAGNOSIS — H353133 Nonexudative age-related macular degeneration, bilateral, advanced atrophic without subfoveal involvement: Secondary | ICD-10-CM | POA: Diagnosis not present

## 2020-12-14 ENCOUNTER — Other Ambulatory Visit: Payer: Self-pay | Admitting: Family Medicine

## 2020-12-14 DIAGNOSIS — Z1231 Encounter for screening mammogram for malignant neoplasm of breast: Secondary | ICD-10-CM

## 2020-12-14 DIAGNOSIS — Z03818 Encounter for observation for suspected exposure to other biological agents ruled out: Secondary | ICD-10-CM | POA: Diagnosis not present

## 2021-01-13 DIAGNOSIS — E559 Vitamin D deficiency, unspecified: Secondary | ICD-10-CM | POA: Diagnosis not present

## 2021-01-13 DIAGNOSIS — E2839 Other primary ovarian failure: Secondary | ICD-10-CM | POA: Diagnosis not present

## 2021-01-13 DIAGNOSIS — J439 Emphysema, unspecified: Secondary | ICD-10-CM | POA: Diagnosis not present

## 2021-01-13 DIAGNOSIS — Z79899 Other long term (current) drug therapy: Secondary | ICD-10-CM | POA: Diagnosis not present

## 2021-01-13 DIAGNOSIS — Z0001 Encounter for general adult medical examination with abnormal findings: Secondary | ICD-10-CM | POA: Diagnosis not present

## 2021-01-13 DIAGNOSIS — I700 Atherosclerosis of aorta: Secondary | ICD-10-CM | POA: Diagnosis not present

## 2021-01-17 ENCOUNTER — Other Ambulatory Visit: Payer: Self-pay | Admitting: Family Medicine

## 2021-01-17 DIAGNOSIS — E2839 Other primary ovarian failure: Secondary | ICD-10-CM

## 2021-01-19 ENCOUNTER — Ambulatory Visit
Admission: RE | Admit: 2021-01-19 | Discharge: 2021-01-19 | Disposition: A | Discharge status: Home / Self Care | Payer: Medicare Other | Source: Ambulatory Visit | Attending: Family Medicine | Admitting: Family Medicine

## 2021-01-19 ENCOUNTER — Other Ambulatory Visit: Payer: Self-pay

## 2021-01-19 DIAGNOSIS — Z1231 Encounter for screening mammogram for malignant neoplasm of breast: Secondary | ICD-10-CM

## 2021-02-05 ENCOUNTER — Other Ambulatory Visit: Payer: Self-pay | Admitting: Internal Medicine

## 2021-02-08 ENCOUNTER — Ambulatory Visit (INDEPENDENT_AMBULATORY_CARE_PROVIDER_SITE_OTHER): Payer: Medicare Other | Admitting: Internal Medicine

## 2021-02-08 ENCOUNTER — Encounter: Payer: Self-pay | Admitting: Internal Medicine

## 2021-02-08 ENCOUNTER — Other Ambulatory Visit: Payer: Self-pay

## 2021-02-08 ENCOUNTER — Ambulatory Visit (INDEPENDENT_AMBULATORY_CARE_PROVIDER_SITE_OTHER): Payer: Medicare Other

## 2021-02-08 DIAGNOSIS — J449 Chronic obstructive pulmonary disease, unspecified: Secondary | ICD-10-CM | POA: Diagnosis not present

## 2021-02-08 DIAGNOSIS — R058 Other specified cough: Secondary | ICD-10-CM | POA: Insufficient documentation

## 2021-02-08 DIAGNOSIS — R059 Cough, unspecified: Secondary | ICD-10-CM | POA: Diagnosis not present

## 2021-02-08 MED ORDER — STIOLTO RESPIMAT 2.5-2.5 MCG/ACT IN AERS: INHALATION_SPRAY | RESPIRATORY_TRACT | 11 refills | Status: DC

## 2021-02-08 NOTE — Progress Notes (Signed)
Alyssa Petty, female    DOB: 1946-03-25,   MRN: [id number]   Brief patient profile:  3  yowf quit smoking 2004/MS phenotype  with doe x hiking with pfts dx as "emphysema" and complicated by by post op hypoxemia 2010 but never treated longterm with 02 and placed on  spiriva and then anoro x 2017 and able to walk up to 5 miles a day thru [place] but avoids hills- concerned about desats when visits fm in [place] with elevations up to 4k feet so referred to pulmonary clinic 01/21/2019 by Dr   Docia Chuck     History of Present Illness  01/21/2019  Pulmonary/ 1st office eval/Wert  Chief Complaint  Patient presents with  . Pulmonary Consult    Referred by Dr Docia Chuck. Pt states traveled to [place] last year and had SOB and decreased o2 sats. She is concerned bc she plans to visit there again soon. She denies any respiratory co's today.   Dyspnea:  Fixed = MMRC1 = can walk nl pace, flat grade, can't hurry or go uphills or steps s sob   Cough: immediately on lying down year round   Sleep: bed is flat SABA use: can def tell it helps and does worse with activity if misses a dose  rec Plan A = Automatic = Stiolto 2pffs each am  Work on inhaler technique:    Plan B = Backup Only use your albuterol inhaler as a rescue medication  Please schedule a follow up office visit in 2 weeks, sooner if needed with pfts on return but use your plan A first     02/05/2019  f/u ov/Wert re:  GOLD I  Improved on stiolto but insurance won't cover Chief Complaint  Patient presents with  . Follow-up    Pt had full PFT prior to ov today. Pt's cough has improved.  Dyspnea:  uphills still doe but on stiolto needed saba less  =  MMRC1 = can walk nl pace, flat grade, can't hurry or go uphills or steps s sob   Cough: at bedtime resolved  Sleeping: flat/ one pillows  SABA use: none  02: no rec Change to bevespi Take 2 puffs first thing in am and then another 2 puffs about 12 hours later. When Bevespi down to zero try  symb 80 Take 2 puffs first thing in am and then another 2 puffs about 12 hours later.  Work on inhaler technique:  For allergies zyrtec 10 mg one at bedtime for itchy sneezy runny nose as needed Please schedule a follow up office visit in 6 weeks, call sooner if needed with your drug formulary all medications /inhalers/ solutions in hand so we can verify exactly what you are taking. This includes all medications from all doctors and over the counters    08/14/2019  f/u ov/Wert re:  GOLD I  On stiolto Chief Complaint  Patient presents with  . Follow-up    Breathing is overall doing well. She only uses her albuterol prior to exercise. She has had some pain in her left rib cage recently.    Dyspnea:  MMRC2 = can't walk a nl pace on a flat grade s sob but does fine slow and flat x 75 min s stopping  Cough: with fresh grass  Sleeping: able to lie flat/ one pillow SABA use: ventolin some  02: none  R and L lateral rib cage pain x 30 years can last for a few min to an hour, then  100% gone,  less likely when lying down x once a month not pleuritic or ex or assoc with any GI symptoms but ? Worse with more raw veggies?  rec Track your 02 level at peak exercise intensity with goal of keeping above 90%  Ok to use ventolin 2 pffs x 15 min before exertion on alternate days to see if it makes a real difference  Classic subdiaphragmatic pain pattern suggests ibs:  Stereotypical, migratory with a very limited distribution of pain locations, daytime, not usually exacerbated by exercise  or coughing, worse in sitting position, frequently associated with generalized abd bloating, not as likely to be present supine due to the dome effect of the diaphragm which  is  canceled in that position. Frequently these patients have had multiple negative GI workups and CT scans. Treatment consists of avoiding foods that cause gas (especially boiled eggs, mexcican food but especially  beans and undercooked vegetables like   spinach and some salads)  and citrucel 1 heaping tsp twice daily with a large glass of water.  Pain should improve w/in 2 weeks and if not then consider further GI work up.    Keep your February 2021 appt if you can.   02/09/2020  f/u ov/Wert re: GOLD I  stiolto / already got 2nd covid 19  Chief Complaint  Patient presents with  . Follow-up    Breathing is doing well and she rarely uses her albuterol inhaler.   Dyspnea:  MMRC2 = can't walk a nl pace on a flat grade s sob but does fine slow and flat  Cough: not much  Sleeping: prone  SABA use: none 02: none - checks at  Half way point ok  rec No change rx  F/u q 12 m   02/08/2021  f/u ov/Wert re: GOLD 1 stiolto 2 each am / no longer on zyrtec cough worse before new dog now staying  in bedroom  Chief Complaint  Patient presents with  . Follow-up    Increased cough at hs for the past several months- occ prod with clear sputum. She rarely uses her albuterol.    Dyspnea:  No change = MMRC2 = can't walk a nl pace on a flat grade s sob but does fine slow and flat  Cough: sev months immediately hs and gone in  10 min occ wakes but minimal  Mucus  Sleeping: bother by hip pain not resp symptoms  SABA use: none  02: none  Covid status:   Max vax (3)    No obvious day to day or daytime variability or assoc excess/ purulent sputum or mucus plugs or hemoptysis or cp or chest tightness, subjective wheeze or overt sinus or hb symptoms.    . Also denies any obvious fluctuation of symptoms with weather or environmental changes or other aggravating or alleviating factors except as outlined above   No unusual exposure hx or h/o childhood pna/ asthma or knowledge of premature birth.  Current Allergies, Complete Past Medical History, Past Surgical History, Family History, and Social History were reviewed in [employer] record.  ROS  The following are not active complaints unless bolded Hoarseness, sore throat, dysphagia, dental  problems, itching, sneezing,  nasal congestion or discharge of excess mucus or purulent secretions, ear ache,   fever, chills, sweats, unintended wt loss or wt gain, classically pleuritic or exertional cp,  orthopnea pnd or arm/hand swelling  or leg swelling, presyncope, palpitations, abdominal pain, anorexia, nausea, vomiting, diarrhea  or  change in bowel habits or change in bladder habits, change in stools or change in urine, dysuria, hematuria,  rash, arthralgias, visual complaints, headache, numbness, weakness or ataxia or problems with walking or coordination,  change in mood or  memory.        Current Meds  Medication Sig  . albuterol (VENTOLIN HFA) 108 (90 Base) MCG/ACT inhaler Inhale 1-2 puffs into the lungs every 4 (four) hours as needed.  . Multiple Vitamins-Minerals (PRESERVISION AREDS 2+MULTI VIT PO) Take 1 tablet by mouth daily.  Marland Kitchen STIOLTO RESPIMAT 2.5-2.5 MCG/ACT AERS INHALE 2 PUFFS BY MOUTH ONCE DAILY  . VITAMIN D PO Take 1 tablet by mouth 3 (three) times a week.           Objective:    02/08/2021          164 02/09/2020          163   08/14/19 157 lb (71.2 kg)  02/05/19 161 lb (73 kg)  01/21/19 162 lb (73.5 kg)    Vital signs reviewed  02/08/2021  - Note at rest 02 sats  94% on RA   General appearance:    amb wm nad    HEENT : pt wearing mask not removed for exam due to covid - 19 concerns.    NECK :  without JVD/Nodes/TM/ nl carotid upstrokes bilaterally   LUNGS: no acc muscle use,  Mild barrel  contour chest wall with bilateral  Distant bs s audible wheeze and  without cough on insp or exp maneuvers  and mild  Hyperresonant  to  percussion bilaterally     CV:  RRR  no s3 or murmur or increase in P2, and no edema   ABD:  soft and nontender with pos end  insp Hoover's  in the supine position. No bruits or organomegaly appreciated, bowel sounds nl  MS:   Nl gait/  ext warm without deformities, calf tenderness, cyanosis or clubbing No obvious joint restrictions    SKIN: warm and dry without lesions    NEURO:  alert, approp, nl sensorium with  no motor or cerebellar deficits apparent.       CXR PA and Lateral:   02/08/2021 :    I personally reviewed images and   impression as follows:  Min copd              Assessment

## 2021-02-08 NOTE — Assessment & Plan Note (Signed)
Onset late 2021  - rx zyrtec, pepcid/bedblocks 02/08/2021 >>>  The immediate cough at hs that extinguishes on its own and does not typically wake her is typical of Upper airway cough syndrome (previously labeled PNDS),  is so named because it's frequently impossible to sort out how much is  CR/sinusitis with freq throat clearing (which can be related to primary GERD)   vs  causing  secondary (" extra esophageal")  GERD from wide swings in gastric pressure that occur with throat clearing, often  promoting self use of mint and menthol lozenges that reduce the lower esophageal sphincter tone and exacerbate the problem further in a cyclical fashion.   These are the same pts (now being labeled as having "irritable larynx syndrome" by some cough centers) who not infrequently have a history of having failed to tolerate ace inhibitors,  dry powder inhalers or biphosphonates or report having atypical/extraesophageal reflux symptoms that don't respond to standard doses of PPI  and are easily confused as having aecopd or asthma flares by even experienced allergists/ pulmonologists (myself included).   rec hs zytrec/ pepcid/ bed blocks and f/u prn          Each maintenance medication was reviewed in detail including emphasizing most importantly the difference between maintenance and prns and under what circumstances the prns are to be triggered using an action plan format where appropriate.  Total time for H and P, chart review, counseling, reviewing [facility]  device(s) and generating customized AVS unique to this office visit / same day charting = 25 min

## 2021-02-08 NOTE — Patient Instructions (Addendum)
Zyrtec 10 mg one half to one an hour  bedtime   If not better add Pepcid 20 mg (otc) one hour bedtime   Bed blocks may also  6-8 inches   Please remember to go to the  x-ray department  for your tests - we will call you with the results when they are available    Please schedule a follow up visit in 12 months but call sooner if needed

## 2021-02-08 NOTE — Assessment & Plan Note (Signed)
Quit smoking 2004 - [facility]  09/16/18 c/w emphysema  - alpha one AT screen 01/21/2019  MS  Level 131  - 01/21/2019    changed to stioilto trial basis due to dry cough and desats at altitudes and with ex  -  PFT's  02/05/2019  FEV1 1.85 (86 % ) ratio 0.67  p 3 % improvement from saba p anoro prior to study with DLCO  56/53 % corrects to 61  % for alv volume  With mod curvatre - 02/05/2019   Walked RA  2 laps @  approx 270ft each @ fast pace  stopped due to  End of study, desats to 84 s sob   - 02/05/2019  After extensive coaching inhaler device,  effectiveness =    90% with hfa so try bevespi x one month (insurance limits to one month )  then symb 160 sample  x 2 weeks and regroup@  6 weeks with formulary  03/24/2019 E-vist - Currently on Symbicort 80 which she states is better than bevespi and Anoro but not as good as Stiolto. Trial Spiriva respimat   > preferred stiolto   - 02/09/2020  After extensive coaching inhaler device,  effectiveness =    90% SMI > continue stiolto   Pt is Group B in terms of symptom/risk and laba/lama therefore appropriate rx at this point >>>  Continue stiolto and prn saba  Re saba: I spent extra time with pt today reviewing appropriate use of albuterol for prn use on exertion with the following points: 1) saba is for relief of sob that does not improve by walking a slower pace or resting but rather if the pt does not improve after trying this first. 2) If the pt is convinced, as many are, that saba helps recover from activity faster then it's easy to tell if this is the case by re-challenging : ie stop, take the inhaler, then p 5 minutes try the exact same activity (intensity of workload) that just caused the symptoms and see if they are substantially diminished or not after saba 3) if there is an activity that reproducibly causes the symptoms, try the saba 15 min before the activity on alternate days   If in fact the saba really does help, then fine to continue to use it prn but  advised may need to look closer at the maintenance regimen being used to achieve better control of airways disease with exertion.

## 2021-04-12 DIAGNOSIS — Z23 Encounter for immunization: Secondary | ICD-10-CM | POA: Diagnosis not present

## 2021-05-13 DIAGNOSIS — H5201 Hypermetropia, right eye: Secondary | ICD-10-CM | POA: Diagnosis not present

## 2021-05-13 DIAGNOSIS — H353133 Nonexudative age-related macular degeneration, bilateral, advanced atrophic without subfoveal involvement: Secondary | ICD-10-CM | POA: Diagnosis not present

## 2021-05-13 DIAGNOSIS — H2513 Age-related nuclear cataract, bilateral: Secondary | ICD-10-CM | POA: Diagnosis not present

## 2021-05-23 DIAGNOSIS — H2512 Age-related nuclear cataract, left eye: Secondary | ICD-10-CM | POA: Diagnosis not present

## 2021-05-23 DIAGNOSIS — H25812 Combined forms of age-related cataract, left eye: Secondary | ICD-10-CM | POA: Diagnosis not present

## 2021-06-16 ENCOUNTER — Other Ambulatory Visit: Payer: Medicare Other

## 2021-06-20 DIAGNOSIS — H25811 Combined forms of age-related cataract, right eye: Secondary | ICD-10-CM | POA: Diagnosis not present

## 2021-06-20 DIAGNOSIS — H2511 Age-related nuclear cataract, right eye: Secondary | ICD-10-CM | POA: Diagnosis not present

## 2021-07-11 DIAGNOSIS — H43811 Vitreous degeneration, right eye: Secondary | ICD-10-CM | POA: Diagnosis not present

## 2021-07-11 DIAGNOSIS — Z961 Presence of intraocular lens: Secondary | ICD-10-CM | POA: Diagnosis not present

## 2021-09-18 DIAGNOSIS — U071 COVID-19: Secondary | ICD-10-CM | POA: Diagnosis not present

## 2021-09-19 DIAGNOSIS — Z23 Encounter for immunization: Secondary | ICD-10-CM | POA: Diagnosis not present

## 2021-09-23 DIAGNOSIS — Z23 Encounter for immunization: Secondary | ICD-10-CM | POA: Diagnosis not present

## 2021-10-26 DIAGNOSIS — Z8709 Personal history of other diseases of the respiratory system: Secondary | ICD-10-CM | POA: Diagnosis not present

## 2021-10-26 DIAGNOSIS — J209 Acute bronchitis, unspecified: Secondary | ICD-10-CM | POA: Diagnosis not present

## 2021-10-26 DIAGNOSIS — Z03818 Encounter for observation for suspected exposure to other biological agents ruled out: Secondary | ICD-10-CM | POA: Diagnosis not present

## 2021-10-26 DIAGNOSIS — R059 Cough, unspecified: Secondary | ICD-10-CM | POA: Diagnosis not present

## 2021-10-26 DIAGNOSIS — R0981 Nasal congestion: Secondary | ICD-10-CM | POA: Diagnosis not present

## 2021-10-26 DIAGNOSIS — J029 Acute pharyngitis, unspecified: Secondary | ICD-10-CM | POA: Diagnosis not present

## 2021-11-08 DIAGNOSIS — H353133 Nonexudative age-related macular degeneration, bilateral, advanced atrophic without subfoveal involvement: Secondary | ICD-10-CM | POA: Diagnosis not present

## 2021-11-12 IMAGING — MG MM DIGITAL SCREENING BILAT W/ TOMO AND CAD
6 of 10 series · 6 of 30 positions shown · non-contrast
Comparison: Previous exam(s).

CLINICAL DATA: Screening.

EXAM:
DIGITAL SCREENING BILATERAL MAMMOGRAM WITH TOMOSYNTHESIS AND CAD
TECHNIQUE: Bilateral screening digital craniocaudal and mediolateral oblique
mammograms were obtained. Bilateral screening digital breast
tomosynthesis was performed. The images were evaluated with
computer-aided detection.

[L CC synth-2D]
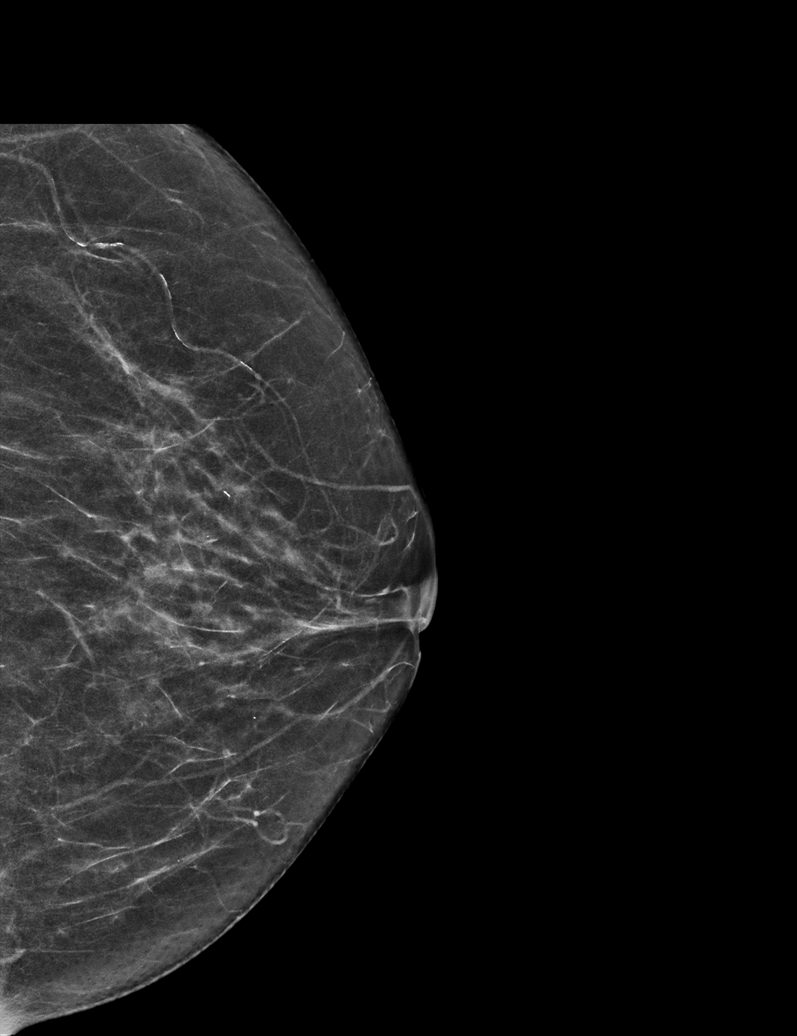

[L MLO synth-2D]
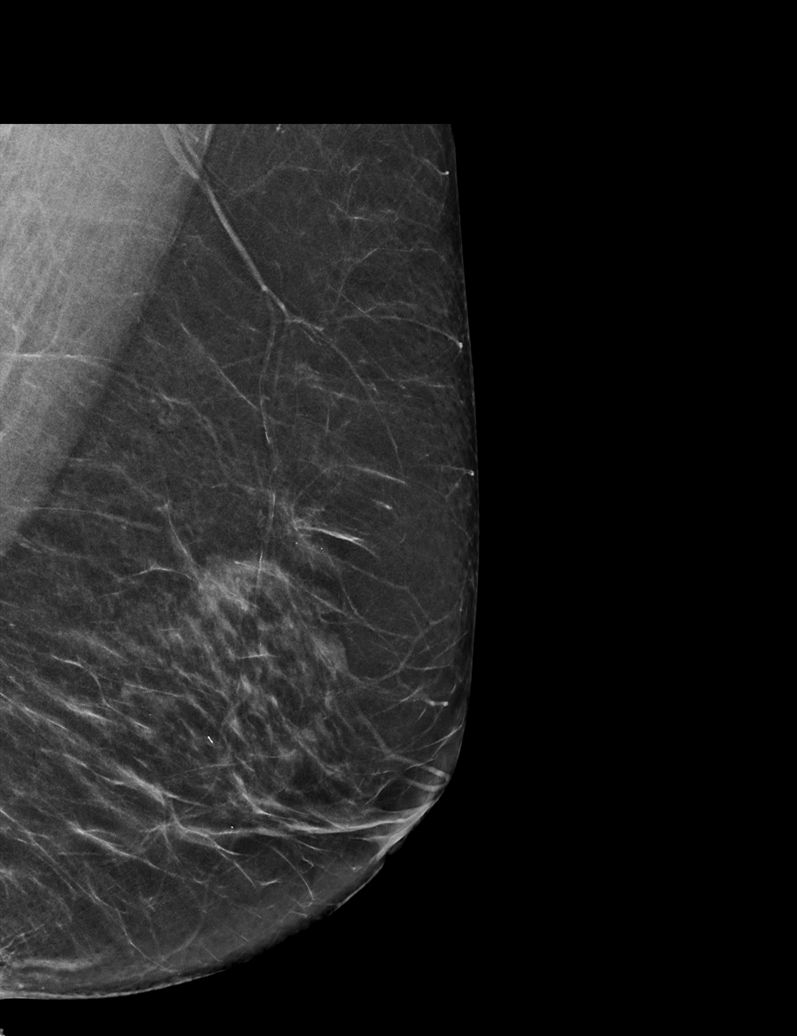

[R CC synth-2D (1 of 2)]
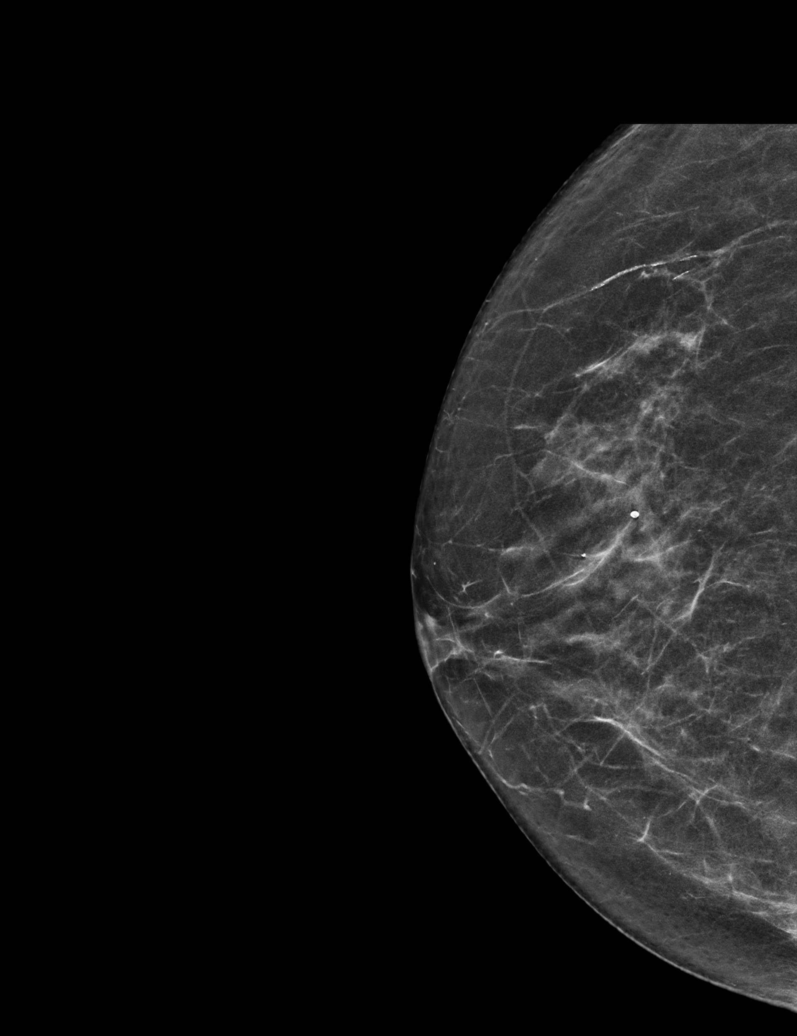

[R MLO synth-2D]
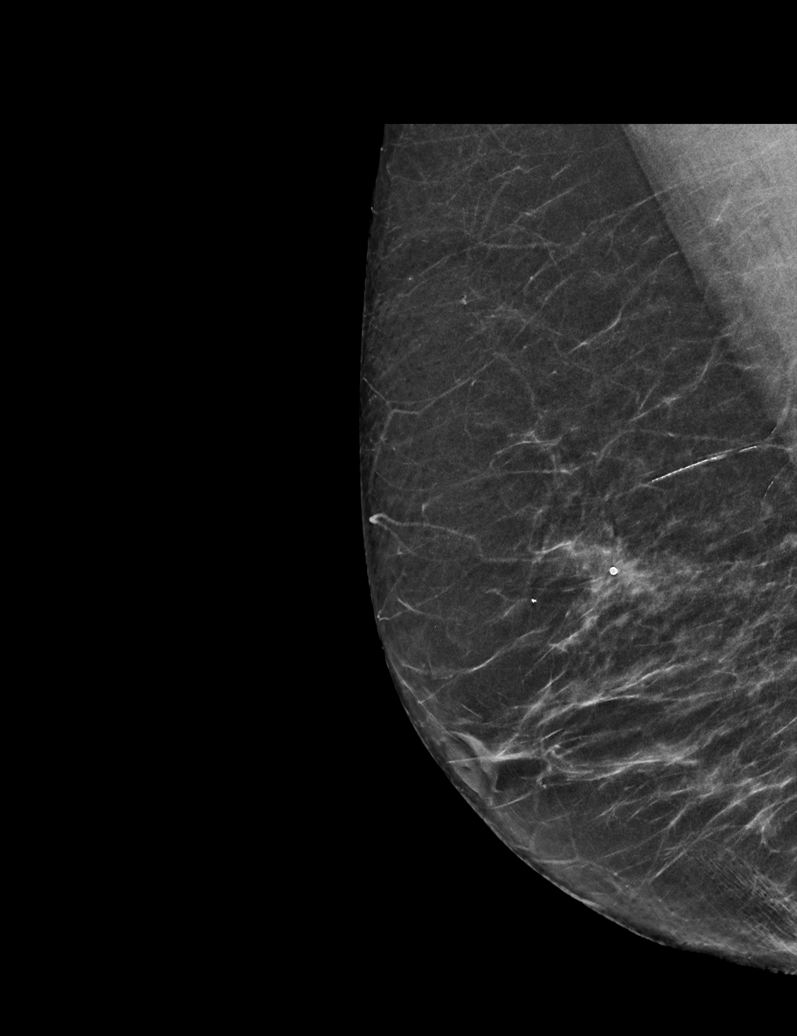

[R CC synth-2D (2 of 2)]
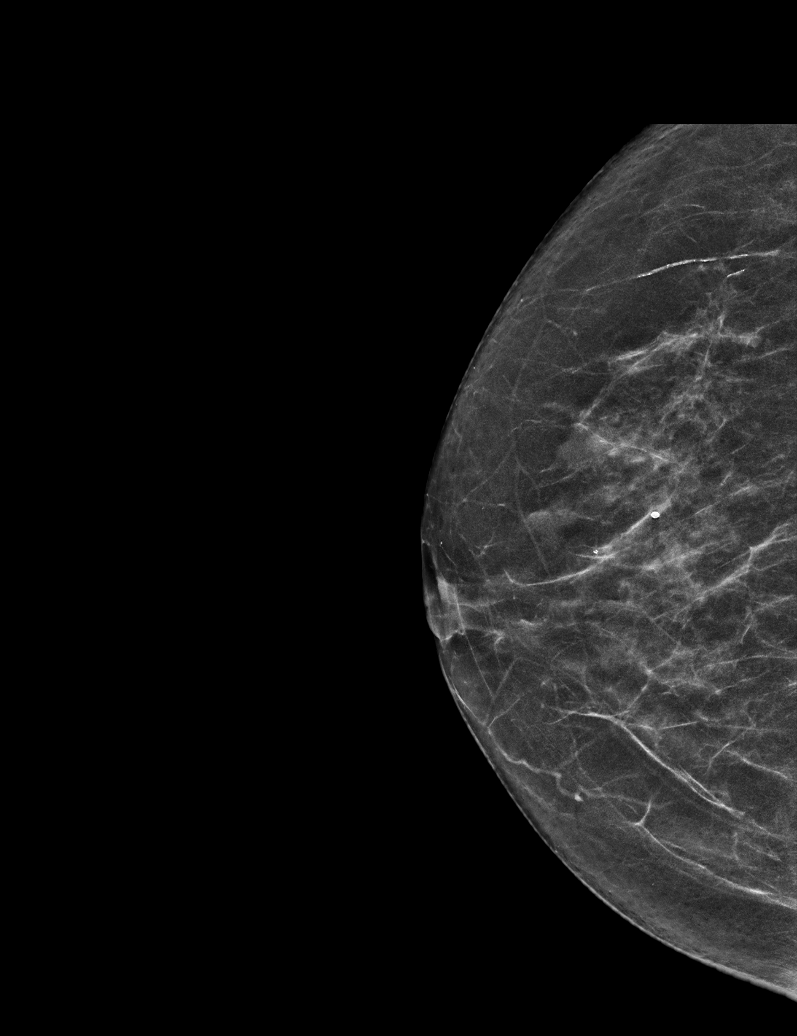

[R CC tomo · tomo slice 31/60.0]
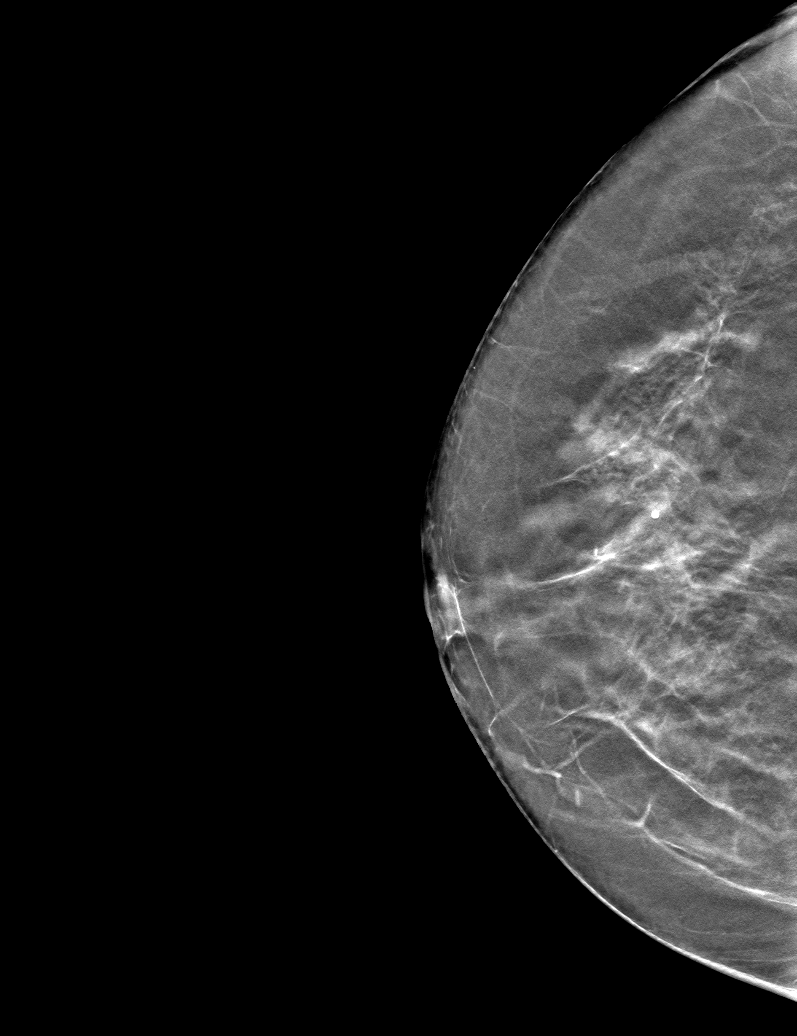

[6 of 30 positions shown; findings below may reference images not displayed]

ACR Breast Density Category b: There are scattered areas of
fibroglandular density.
FINDINGS: There are no findings suspicious for malignancy.
IMPRESSION: No mammographic evidence of malignancy. A result letter of this
screening mammogram will be mailed directly to the patient.

RECOMMENDATION:
Screening mammogram in one year. (Code:51-O-LD2)

BI-RADS CATEGORY  1: Negative.

## 2021-11-22 ENCOUNTER — Ambulatory Visit
Admission: RE | Admit: 2021-11-22 | Discharge: 2021-11-22 | Disposition: A | Discharge status: Home / Self Care | Payer: Medicare Other | Source: Ambulatory Visit | Attending: Family Medicine | Admitting: Family Medicine

## 2021-11-22 DIAGNOSIS — E2839 Other primary ovarian failure: Secondary | ICD-10-CM

## 2021-11-22 DIAGNOSIS — Z780 Asymptomatic menopausal state: Secondary | ICD-10-CM | POA: Diagnosis not present

## 2022-01-26 DIAGNOSIS — H35371 Puckering of macula, right eye: Secondary | ICD-10-CM | POA: Diagnosis not present

## 2022-01-26 DIAGNOSIS — H353124 Nonexudative age-related macular degeneration, left eye, advanced atrophic with subfoveal involvement: Secondary | ICD-10-CM | POA: Diagnosis not present

## 2022-01-26 DIAGNOSIS — H43813 Vitreous degeneration, bilateral: Secondary | ICD-10-CM | POA: Diagnosis not present

## 2022-01-26 DIAGNOSIS — H353113 Nonexudative age-related macular degeneration, right eye, advanced atrophic without subfoveal involvement: Secondary | ICD-10-CM | POA: Diagnosis not present

## 2022-02-08 ENCOUNTER — Other Ambulatory Visit: Payer: Self-pay

## 2022-02-08 ENCOUNTER — Ambulatory Visit (INDEPENDENT_AMBULATORY_CARE_PROVIDER_SITE_OTHER): Payer: Medicare Other | Admitting: Internal Medicine

## 2022-02-08 ENCOUNTER — Encounter: Payer: Self-pay | Admitting: Internal Medicine

## 2022-02-08 DIAGNOSIS — J449 Chronic obstructive pulmonary disease, unspecified: Secondary | ICD-10-CM

## 2022-02-08 NOTE — Patient Instructions (Signed)
No change in medications ? ?Ok to try albuterol 15 min before an activity (on alternating days)  that you know would usually make you short of breath and see if it makes any difference and if makes none then don't take albuterol after activity unless you can't catch your breath as this means it's the resting that helps, not the albuterol. ?    ? ?Ok to get out in public and wear mask in crowded indoors like theaters and airplanes ? ?Please schedule a follow up visit in 12 months but call sooner if needed  ?

## 2022-02-08 NOTE — Progress Notes (Signed)
? ?Alyssa Petty, female    DOB: 1946-07-14,   MRN: [id number] ? ? ?Brief patient profile:  ?3 yowf quit smoking 2004/MS phenotype  with doe x hiking with pfts dx as "emphysema" and complicated by by post op hypoxemia 2010 but never treated longterm with 02 and placed on  spiriva and then anoro x 2017 and able to walk up [place] 5 [place] a [place] but [place]- concerned about desats when visits fm in [place] with elevations up to 4k feet so referred to pulmonary clinic 01/21/2019 by Dr   Docia Chuck  ? ? ? ?History of Present Illness  ?01/21/2019  Pulmonary/ 1st office eval/[name]  ?Chief Complaint  ?Patient presents with  ? Pulmonary Consult  ?  Referred by Dr Docia Chuck. Pt states traveled to [place] last year and had SOB and decreased o2 sats. She is concerned bc she plans to visit there again soon. She denies any respiratory co's today.   ?Dyspnea:  Fixed = MMRC1 = can walk nl pace, flat grade, can't hurry or go uphills or steps s sob   ?Cough: immediately on lying down year round   ?Sleep: bed is flat ?SABA use: can def tell it helps and does worse with activity if misses a dose  ?rec ?Plan A = Automatic = Stiolto 2pffs each am  ?Work on inhaler technique:   ? Plan B = Backup ?Only use your albuterol inhaler as a rescue medication ? Please schedule a follow up office visit in 2 weeks, sooner if needed with pfts on return but use your plan A first  ? ? ? ?02/05/2019  f/u ov/[name] re:  GOLD I  Improved on stiolto but insurance won't cover ?Chief Complaint  ?Patient presents with  ? Follow-up  ?  Pt had full PFT prior to ov today. Pt's cough has improved.  ?Dyspnea:  uphills still doe but on stiolto needed saba less  =  MMRC1 = can walk nl pace, flat grade, can't hurry or go uphills or steps s sob   ?Cough: at bedtime resolved  ?Sleeping: flat/ one pillows  ?SABA use: none  ?02: no ?rec ?Change to bevespi Take 2 puffs first thing in am and then another 2 puffs about 12 hours later. ?When Bevespi down to zero try  symb 80 Take 2 puffs first thing in am and then another 2 puffs about 12 hours later.  ?Work on inhaler technique:  ?For allergies zyrtec 10 mg one at bedtime for itchy sneezy runny nose as needed ?Please schedule a follow up office visit in 6 weeks, call sooner if needed with your drug formulary all medications /inhalers/ solutions in hand  ?  ? ?  ? ? ?02/08/2021  f/u ov/[name] re: GOLD 1 stiolto 2 each am / no longer on zyrtec cough worse before new dog now staying  in bedroom  ?Chief Complaint  ?Patient presents with  ? Follow-up  ?  Increased cough at hs for the past several months- occ prod with clear sputum. She rarely uses her albuterol.   ? Dyspnea:  No change = MMRC2 = can't walk a nl pace on a flat grade s sob but does fine slow and flat  ?Cough: sev months immediately hs and gone in  10 min occ wakes but minimal  Mucus  ?Sleeping: bother by hip pain not resp symptoms  ?SABA use: none  ?02: none  ?Covid status:   Max vax (3)  ?Rec ?Zyrtec 10 mg one  half to one an hour  bedtime  ?If not better add Pepcid 20 mg (otc) one hour bedtime  ?Bed blocks may also  6-8 inches ? ? ?02/08/2022  f/u ov/[name] re: GOLD 1 maint on Stiolto  ?Chief Complaint  ?Patient presents with  ? Follow-up  ?  Follow up. Patient has no complaints.   ?Dyspnea:  30 min walking [place], some hills sometimes has s ?Cough: no am congestion /pnds controlled on  ?Sleeping: no resp cc despite dog in bedroom  ?SABA use: none  ?02: none  ?Covid status:   vax x 4  ? ?No obvious day to day or daytime variability or assoc excess/ purulent sputum or mucus plugs or hemoptysis or cp or chest tightness, subjective wheeze or overt sinus or hb symptoms.  ? ?Sleeping  without nocturnal  or early am exacerbation  of respiratory  c/o's or need for noct saba. Also denies any obvious fluctuation of symptoms with weather or environmental changes or other aggravating or alleviating factors except as outlined above  ? ?No unusual exposure hx or h/o childhood pna/ asthma  or knowledge of premature birth. ? ?Current Allergies, Complete Past Medical History, Past Surgical History, Family History, and Social History were reviewed in [employer] record. ? ?ROS  The following are not active complaints unless bolded ?Hoarseness, sore throat, dysphagia, dental problems, itching, sneezing,  nasal congestion or discharge of excess mucus or purulent secretions, ear ache,   fever, chills, sweats, unintended wt loss or wt gain, classically pleuritic or exertional cp,  orthopnea pnd or arm/hand swelling  or leg swelling, presyncope, palpitations, abdominal pain, anorexia, nausea, vomiting, diarrhea  or change in bowel habits or change in bladder habits, change in stools or change in urine, dysuria, hematuria,  rash, arthralgias, visual complaints, headache, numbness, weakness or ataxia or problems with walking or coordination,  change in mood or  memory. ?      ? ?Current Meds  ?Medication Sig  ? albuterol (VENTOLIN HFA) 108 (90 Base) MCG/ACT inhaler Inhale 1-2 puffs into the lungs every 4 (four) hours as needed.  ? Multiple Vitamins-Minerals (PRESERVISION AREDS 2+MULTI VIT PO) Take 1 tablet by mouth daily.  ? Tiotropium Bromide-Olodaterol (STIOLTO RESPIMAT) 2.5-2.5 MCG/ACT AERS INHALE 2 PUFFS BY MOUTH ONCE DAILY  ? VITAMIN D PO Take 1 tablet by mouth 3 (three) times a week.   ?    ?  ? ?  ? ?Objective:  ?  ?Wt ? ?02/08/2022          164   ?02/08/2021          164 ?02/09/2020          163   ?08/14/19 157 lb (71.2 kg)  ?02/05/19 161 lb (73 kg)  ?01/21/19 162 lb (73.5 kg)  ?  ? Vital signs reviewed  02/08/2022  - Note at rest 02 sats  95% on RA  ? ?General appearance:    pleasant wmb wf nad   ? ? ?HEENT : pt wearing mask not removed for exam due to covid - 19 concerns.  ? ? ?NECK :  without JVD/Nodes/TM/ nl carotid upstrokes bilaterally ? ? ?LUNGS: no acc muscle use,  Mild barrel  contour chest wall with bilateral  Distant bs s audible wheeze and  without cough on insp or exp  maneuvers  and mild  Hyperresonant  to  percussion bilaterally   ? ? ?CV:  RRR  no s3 or murmur or increase in P2, and  no edema  ? ?ABD:  soft and nontender with pos end  insp Hoover's  in the supine position. No bruits or organomegaly appreciated, bowel sounds nl ? ?MS:   Nl gait/  ext warm without deformities, calf tenderness, cyanosis or clubbing ?No obvious joint restrictions  ? ?SKIN: warm and dry without lesions   ? ?NEURO:  alert, approp, nl sensorium with  no motor or cerebellar deficits apparent.  ?    ? ?    ?Assessment  ? ?  ? ? ? ? ?  ?

## 2022-02-08 NOTE — Assessment & Plan Note (Addendum)
Quit smoking 2004 ?- LDSCT  09/16/18 c/w emphysema  ?- alpha one AT screen 01/21/2019  MS  Level 131  ?- 01/21/2019    changed to stioilto trial basis due to dry cough and desats at altitudes and with ex  ?-  PFT's  02/05/2019  FEV1 1.85 (86 % ) ratio 0.67  p 3 % improvement from saba p anoro prior to study with DLCO  56/53 % corrects to 61  % for alv volume  With mod curvatre ?- 02/05/2019   Walked RA  2 laps @  approx 255ft each @ fast pace  stopped due to  End of study, desats to 84 s sob   ?- 02/05/2019  After extensive coaching inhaler device,  effectiveness =    90% with hfa so try bevespi x one month (insurance limits to one month )  then symb 160 sample  x 2 weeks and regroup@  6 weeks with formulary  ?03/24/2019 E-vist - Currently on Symbicort 80 which she states is better than bevespi and Anoro but not as good as Stiolto. Trial Spiriva respimat   > preferred stiolto   ?- 02/09/2020  After extensive coaching inhaler device,  effectiveness =    90% SMI > continue stiolto  ? ?Pt is Group B in terms of symptom/risk and laba/lama therefore appropriate rx at this point >>>  Continue stiolto and prn sab ? ?Re SABA :  I spent extra time with pt today reviewing appropriate use of albuterol for prn use on exertion with the following points: ?1) saba is for relief of sob that does not improve by walking a slower pace or resting but rather if the pt does not improve after trying this first. ?2) If the pt is convinced, as many are, that saba helps recover from activity faster then it's easy to tell if this is the case by re-challenging : ie stop, take the inhaler, then p 5 minutes try the exact same activity (intensity of workload) that just caused the symptoms and see if they are substantially diminished or not after saba ?3) if there is an activity that reproducibly causes the symptoms, try the saba 15 min before the activity on alternate days  ? ?If in fact the saba really does help, then fine to continue to use it prn but  advised may need to look closer at the maintenance regimen being used to achieve better control of airways disease with exertion.  ? ? ?    ?  ? ?Each maintenance medication was reviewed in detail including emphasizing most importantly the difference between maintenance and prns and under what circumstances the prns are to be triggered using an action plan format where appropriate. ? ?Total time for H and P, chart review, counseling, reviewing smi(respimat)  device(s) and generating customized AVS unique to this office visit / same day charting =  ?      ? ?  ?

## 2022-02-23 DIAGNOSIS — E559 Vitamin D deficiency, unspecified: Secondary | ICD-10-CM | POA: Diagnosis not present

## 2022-02-23 DIAGNOSIS — Z0000 Encounter for general adult medical examination without abnormal findings: Secondary | ICD-10-CM | POA: Diagnosis not present

## 2022-02-23 DIAGNOSIS — Z79899 Other long term (current) drug therapy: Secondary | ICD-10-CM | POA: Diagnosis not present

## 2022-02-23 DIAGNOSIS — I700 Atherosclerosis of aorta: Secondary | ICD-10-CM | POA: Diagnosis not present

## 2022-02-23 DIAGNOSIS — J439 Emphysema, unspecified: Secondary | ICD-10-CM | POA: Diagnosis not present

## 2022-03-04 ENCOUNTER — Other Ambulatory Visit: Payer: Self-pay | Admitting: Internal Medicine

## 2022-03-07 DIAGNOSIS — U071 COVID-19: Secondary | ICD-10-CM | POA: Diagnosis not present

## 2022-03-30 DIAGNOSIS — E785 Hyperlipidemia, unspecified: Secondary | ICD-10-CM | POA: Diagnosis not present

## 2022-04-03 ENCOUNTER — Other Ambulatory Visit: Payer: Self-pay | Admitting: Internal Medicine

## 2022-04-07 ENCOUNTER — Telehealth: Payer: Self-pay | Admitting: Internal Medicine

## 2022-04-07 NOTE — Telephone Encounter (Signed)
Spoke with pt and reviewed advise as given by Dr. Sherene Sires. Pt stated understanding. Nothing further needed at this time.  ?

## 2022-04-07 NOTE — Telephone Encounter (Signed)
Since she has had both the 23 and the prenar 13 there is really no need for additional pneumonia vaccines from my perspective but this really the domain of the PCP who should make the final call     ?

## 2022-04-07 NOTE — Telephone Encounter (Signed)
Name Date  ?Fluad Quad(high Dose 65+) 08/14/2019  ?Influenza Split 09/23/2020 , 08/12/2019 , 09/02/2018  ?Influenza, High Dose Seasonal PF 09/23/2021 , 09/02/2018  ?Pfizer COVID-19 Vaccine 04/12/2021 , 09/07/2020 , 01/26/2020 , 01/05/2020  ?Art gallery manager Booster 09/19/2021  ?Pneumococcal Conjugate-13 03/01/2014  ?Pneumococcal Polysaccharide-23 06/20/2017  ?Tdap 03/08/2021  ?Zoster, Live 06/12/2017 , 03/18/2017  ? ?Dr. Sherene Sires, can you please advise about the Prevnar 20?  ?

## 2022-05-04 DIAGNOSIS — Z23 Encounter for immunization: Secondary | ICD-10-CM | POA: Diagnosis not present

## 2022-06-15 DIAGNOSIS — H35371 Puckering of macula, right eye: Secondary | ICD-10-CM | POA: Diagnosis not present

## 2022-06-15 DIAGNOSIS — H43813 Vitreous degeneration, bilateral: Secondary | ICD-10-CM | POA: Diagnosis not present

## 2022-06-15 DIAGNOSIS — H353124 Nonexudative age-related macular degeneration, left eye, advanced atrophic with subfoveal involvement: Secondary | ICD-10-CM | POA: Diagnosis not present

## 2022-06-15 DIAGNOSIS — H353113 Nonexudative age-related macular degeneration, right eye, advanced atrophic without subfoveal involvement: Secondary | ICD-10-CM | POA: Diagnosis not present

## 2022-07-03 DIAGNOSIS — H353124 Nonexudative age-related macular degeneration, left eye, advanced atrophic with subfoveal involvement: Secondary | ICD-10-CM | POA: Diagnosis not present

## 2022-08-24 DIAGNOSIS — H43813 Vitreous degeneration, bilateral: Secondary | ICD-10-CM | POA: Diagnosis not present

## 2022-08-24 DIAGNOSIS — H353124 Nonexudative age-related macular degeneration, left eye, advanced atrophic with subfoveal involvement: Secondary | ICD-10-CM | POA: Diagnosis not present

## 2022-08-24 DIAGNOSIS — H353113 Nonexudative age-related macular degeneration, right eye, advanced atrophic without subfoveal involvement: Secondary | ICD-10-CM | POA: Diagnosis not present

## 2022-08-24 DIAGNOSIS — H35371 Puckering of macula, right eye: Secondary | ICD-10-CM | POA: Diagnosis not present

## 2022-09-08 DIAGNOSIS — Z23 Encounter for immunization: Secondary | ICD-10-CM | POA: Diagnosis not present

## 2022-09-20 DIAGNOSIS — Z23 Encounter for immunization: Secondary | ICD-10-CM | POA: Diagnosis not present

## 2022-10-19 DIAGNOSIS — H43813 Vitreous degeneration, bilateral: Secondary | ICD-10-CM | POA: Diagnosis not present

## 2022-10-19 DIAGNOSIS — H353113 Nonexudative age-related macular degeneration, right eye, advanced atrophic without subfoveal involvement: Secondary | ICD-10-CM | POA: Diagnosis not present

## 2022-10-19 DIAGNOSIS — H43391 Other vitreous opacities, right eye: Secondary | ICD-10-CM | POA: Diagnosis not present

## 2022-10-19 DIAGNOSIS — H353124 Nonexudative age-related macular degeneration, left eye, advanced atrophic with subfoveal involvement: Secondary | ICD-10-CM | POA: Diagnosis not present

## 2022-10-19 DIAGNOSIS — H35371 Puckering of macula, right eye: Secondary | ICD-10-CM | POA: Diagnosis not present

## 2022-12-03 DIAGNOSIS — U071 COVID-19: Secondary | ICD-10-CM | POA: Diagnosis not present

## 2022-12-14 DIAGNOSIS — H353114 Nonexudative age-related macular degeneration, right eye, advanced atrophic with subfoveal involvement: Secondary | ICD-10-CM | POA: Diagnosis not present

## 2023-02-08 DIAGNOSIS — H353134 Nonexudative age-related macular degeneration, bilateral, advanced atrophic with subfoveal involvement: Secondary | ICD-10-CM | POA: Diagnosis not present

## 2023-02-08 DIAGNOSIS — H353114 Nonexudative age-related macular degeneration, right eye, advanced atrophic with subfoveal involvement: Secondary | ICD-10-CM | POA: Diagnosis not present

## 2023-02-08 DIAGNOSIS — H35371 Puckering of macula, right eye: Secondary | ICD-10-CM | POA: Diagnosis not present

## 2023-02-08 DIAGNOSIS — H43813 Vitreous degeneration, bilateral: Secondary | ICD-10-CM | POA: Diagnosis not present

## 2023-02-27 DIAGNOSIS — Z79899 Other long term (current) drug therapy: Secondary | ICD-10-CM | POA: Diagnosis not present

## 2023-02-27 DIAGNOSIS — Z0001 Encounter for general adult medical examination with abnormal findings: Secondary | ICD-10-CM | POA: Diagnosis not present

## 2023-02-27 DIAGNOSIS — E7800 Pure hypercholesterolemia, unspecified: Secondary | ICD-10-CM | POA: Diagnosis not present

## 2023-02-27 DIAGNOSIS — J439 Emphysema, unspecified: Secondary | ICD-10-CM | POA: Diagnosis not present

## 2023-02-27 DIAGNOSIS — I700 Atherosclerosis of aorta: Secondary | ICD-10-CM | POA: Diagnosis not present

## 2023-02-27 DIAGNOSIS — E559 Vitamin D deficiency, unspecified: Secondary | ICD-10-CM | POA: Diagnosis not present

## 2023-03-01 ENCOUNTER — Ambulatory Visit (INDEPENDENT_AMBULATORY_CARE_PROVIDER_SITE_OTHER): Payer: Medicare Other | Admitting: Internal Medicine

## 2023-03-01 ENCOUNTER — Encounter: Payer: Self-pay | Admitting: Internal Medicine

## 2023-03-01 ENCOUNTER — Ambulatory Visit (INDEPENDENT_AMBULATORY_CARE_PROVIDER_SITE_OTHER): Payer: Medicare Other

## 2023-03-01 VITALS — BP 126/74 | HR 84 | Temp 98.3°F | Ht 63.0 in | Wt 162.0 lb

## 2023-03-01 DIAGNOSIS — J302 Other seasonal allergic rhinitis: Secondary | ICD-10-CM

## 2023-03-01 DIAGNOSIS — R0600 Dyspnea, unspecified: Secondary | ICD-10-CM | POA: Diagnosis not present

## 2023-03-01 DIAGNOSIS — J449 Chronic obstructive pulmonary disease, unspecified: Secondary | ICD-10-CM

## 2023-03-01 MED ORDER — AZELASTINE-FLUTICASONE 137-50 MCG/ACT NA SUSP: 1.0000 | Freq: Two times a day (BID) | NASAL | 11 refills | Status: DC

## 2023-03-01 NOTE — Progress Notes (Signed)
Alyssa Petty, female    DOB: [birth year],   MRN: [id number]   Brief patient profile:  76 yowf quit smoking 2004/MS phenotype  with doe x hiking with pfts dx as "emphysema" and complicated by by post op hypoxemia 2010 but never treated longterm with 02 and placed on  spiriva and then anoro x 2017 and able to walk up to 5 miles a day thru [place] but avoids hills- concerned about desats when visits fm in [place] with elevations up to 4k feet so referred to pulmonary clinic 01/21/2019 by Dr   Dorthy Cooler     History of Present Illness  01/21/2019  Pulmonary/ 1st office eval/Alyssa Petty  Chief Complaint  Patient presents with   Pulmonary Consult    Referred by Dr Dorthy Cooler. Pt states traveled to [place] last year and had SOB and decreased o2 sats. She is concerned bc she plans to visit there again soon. She denies any respiratory co's today.   Dyspnea:  Fixed = MMRC1 = can walk nl pace, flat grade, can't hurry or go uphills or steps s sob   Cough: immediately on lying down year round   Sleep: bed is flat SABA use: can def tell it helps and does worse with activity if misses a dose  rec Plan A = Automatic = Stiolto 2pffs each am  Work on inhaler technique:    Plan B = Backup Only use your albuterol inhaler as a rescue medication  Please schedule a follow up office visit in 2 weeks, sooner if needed with pfts on return but use your plan A first      02/08/2022  f/u ov/Alyssa Petty re: GOLD 1 maint on Stiolto  Chief Complaint  Patient presents with   Follow-up    Follow up. Patient has no complaints.   Dyspnea:  30 min walking nbhood, some hills sometimes has s Cough: no am congestion /pnds controlled on  Sleeping: no resp cc despite dog in bedroom  SABA use: none  02: none  Covid status:   vax x 4  Rec No change in medications Ok to try albuterol 15 min before an activity (on alternating days)  that you know would usually make you short of breath  Ok to get out in public and wear mask in crowded  indoors like theaters and airplanes    03/01/2023  f/u ov/Alyssa Petty re: GOLD 1    maint on Stiolto 2 puffs daily   Chief Complaint  Patient presents with   Follow-up    COVID December 2023.  Cough, patient feels due to allergies/pollen season.  Dyspnea:  30 min walking nbhood  Cough: some with pollen  Sleeping: bed is flat/ one pillow  SABA use: sometimes with walking  02: none  Covid status:   vax x all      No obvious day to day or daytime variability or assoc excess/ purulent sputum or mucus plugs or hemoptysis or cp or chest tightness, subjective wheeze or overt   hb symptoms.   Sleeping  without nocturnal  or early am exacerbation  of respiratory  c/o's or need for noct saba. Also denies any obvious fluctuation of symptoms with weather or environmental changes or other aggravating or alleviating factors except as outlined above   No unusual exposure hx or h/o childhood pna/ asthma or knowledge of premature birth.  Current Allergies, Complete Past Medical History, Past Surgical History, Family History, and Social History were reviewed in [employer] record.  ROS  The following are not active complaints unless bolded Hoarseness, sore throat, dysphagia, dental problems, itching, sneezing,  nasal congestion or discharge of excess mucus or purulent secretions, ear ache,   fever, chills, sweats, unintended wt loss or wt gain, classically pleuritic or exertional cp,  orthopnea pnd or arm/hand swelling  or leg swelling, presyncope, palpitations, abdominal pain, anorexia, nausea, vomiting, diarrhea  or change in bowel habits or change in bladder habits, change in stools or change in urine, dysuria, hematuria,  rash, arthralgias, visual complaints, headache, numbness, weakness or ataxia or problems with walking or coordination,  change in mood or  memory.        Current Meds  Medication Sig   albuterol (VENTOLIN HFA) 108 (90 Base) MCG/ACT inhaler Inhale 1-2 puffs into the  lungs every 4 (four) hours as needed.   Multiple Vitamins-Minerals (PRESERVISION AREDS 2+MULTI VIT PO) Take 1 tablet by mouth daily.   rosuvastatin (CRESTOR) 5 MG tablet Take 5 mg by mouth daily.   Tiotropium Bromide-Olodaterol (STIOLTO RESPIMAT) 2.5-2.5 MCG/ACT AERS INHALE 2 PUFFS BY MOUTH ONCE DAILY   VITAMIN D PO Take 1 tablet by mouth 3 (three) times a week.          Objective:    Wt  03/01/2023        162  02/08/2022          164   02/08/2021          164 02/09/2020          163   08/14/19 157 lb (71.2 kg)  02/05/19 161 lb (73 kg)  01/21/19 162 lb (73.5 kg)    Vital signs reviewed  03/01/2023  - Note at rest 02 sats  91% on RA   General appearance:    amb wf nad    HEENT : Oropharynx  clear   Nasal turbinates mon non specific edema/no polyps   NECK :  without  apparent JVD/ palpable Nodes/TM    LUNGS: no acc muscle use,  Mild barrel  contour chest wall with bilateral  Distant bs s audible wheeze and  without cough on insp or exp maneuvers  and mild  Hyperresonant  to  percussion bilaterally     CV:  RRR  no s3 or murmur or increase in P2, and no edema   ABD:  soft and nontender with pos end  insp Hoover's  in the supine position.  No bruits or organomegaly appreciated   MS:  Nl gait/ ext warm without deformities Or obvious joint restrictions  calf tenderness, cyanosis or clubbing     SKIN: warm and dry without lesions    NEURO:  alert, approp, nl sensorium with  no motor or cerebellar deficits apparent.       CXR PA and Lateral:   03/01/2023 :    I personally reviewed images and impression is as follows:     Mild copd findings / no acute changes          Assessment

## 2023-03-01 NOTE — Patient Instructions (Addendum)
Generic dymista one twice daily as needed for nasal symptoms in spring   We will walk you today to see if you qualify for portable 02    Please remember to go to the  x-ray department  for your tests - we will call you with the results when they are available

## 2023-03-01 NOTE — Assessment & Plan Note (Addendum)
Trial of dymista 03/01/2023 >>>   Advised dymista can be a maint rx during fall/ spring with zyrtec as a supplement prn but neither needed at other times of the year  Each maintenance medication was reviewed in detail including emphasizing most importantly the difference between maintenance and prns and under what circumstances the prns are to be triggered using an action plan format where appropriate.  Total time for H and P, chart review, counseling, reviewing smi/hfa/nasal device(s) , directly observing portions of ambulatory 02 saturation study/ and generating customized AVS unique to this office visit / same day charting = 35 min

## 2023-03-01 NOTE — Assessment & Plan Note (Addendum)
Quit smoking 2004 - LDSCT  09/16/18 c/w emphysema  - alpha one AT screen 01/21/2019  MS  Level 131  - 01/21/2019    changed to stioilto trial basis due to dry cough and desats at altitudes and with ex  -  PFT's  02/05/2019  FEV1 1.85 (86 % ) ratio 0.67  p 3 % improvement from saba p anoro prior to study with DLCO  56/53 % corrects to 61  % for alv volume  With mod curvatre - 02/05/2019   Walked RA  2 laps @  approx 262ft each @ fast pace  stopped due to  End of study, desats to 84 s sob   - 02/05/2019  After extensive coaching inhaler device,  effectiveness =    90% with hfa so try bevespi x one month (insurance limits to one month )  then symb 160 sample  x 2 weeks and regroup@  6 weeks with formulary  03/24/2019 E-vist - Currently on Symbicort 80 which she states is better than bevespi and Anoro but not as good as Stiolto. Trial Spiriva respimat   > preferred stiolto   - 02/09/2020  After extensive coaching inhaler device,  effectiveness =    90% SMI > continue stiolto  - 03/01/2023   Walked on RA  x  3  lap(s) =  approx 750  ft  @ brisk pace, stopped due to end of study s sob  with lowest 02 sats 92%   Pt is Group B in terms of symptom/risk and laba/lama therefore appropriate rx at this point >>>  continue stiolto and approp saba  Re SABA :  I spent extra time with pt today reviewing appropriate use of albuterol for prn use on exertion with the following points: 1) saba is for relief of sob that does not improve by walking a slower pace or resting but rather if the pt does not improve after trying this first. 2) If the pt is convinced, as many are, that saba helps recover from activity faster then it's easy to tell if this is the case by re-challenging : ie stop, take the inhaler, then p 5 minutes try the exact same activity (intensity of workload) that just caused the symptoms and see if they are substantially diminished or not after saba 3) if there is an activity that reproducibly causes the symptoms,  try the saba 15 min before the activity on alternate days   If in fact the saba really does help, then fine to continue to use it prn but advised may need to look closer at the maintenance regimen being used to achieve better control of airways disease with exertion.

## 2023-04-05 DIAGNOSIS — H353114 Nonexudative age-related macular degeneration, right eye, advanced atrophic with subfoveal involvement: Secondary | ICD-10-CM | POA: Diagnosis not present

## 2023-04-05 DIAGNOSIS — H353134 Nonexudative age-related macular degeneration, bilateral, advanced atrophic with subfoveal involvement: Secondary | ICD-10-CM | POA: Diagnosis not present

## 2023-04-05 DIAGNOSIS — H35033 Hypertensive retinopathy, bilateral: Secondary | ICD-10-CM | POA: Diagnosis not present

## 2023-04-05 DIAGNOSIS — H43813 Vitreous degeneration, bilateral: Secondary | ICD-10-CM | POA: Diagnosis not present

## 2023-04-05 DIAGNOSIS — H35371 Puckering of macula, right eye: Secondary | ICD-10-CM | POA: Diagnosis not present

## 2023-04-14 ENCOUNTER — Other Ambulatory Visit: Payer: Self-pay | Admitting: Internal Medicine

## 2023-05-11 ENCOUNTER — Other Ambulatory Visit: Payer: Self-pay | Admitting: Internal Medicine

## 2023-06-07 DIAGNOSIS — H35371 Puckering of macula, right eye: Secondary | ICD-10-CM | POA: Diagnosis not present

## 2023-06-07 DIAGNOSIS — H43813 Vitreous degeneration, bilateral: Secondary | ICD-10-CM | POA: Diagnosis not present

## 2023-06-07 DIAGNOSIS — H353134 Nonexudative age-related macular degeneration, bilateral, advanced atrophic with subfoveal involvement: Secondary | ICD-10-CM | POA: Diagnosis not present

## 2023-06-07 DIAGNOSIS — H35033 Hypertensive retinopathy, bilateral: Secondary | ICD-10-CM | POA: Diagnosis not present

## 2023-06-07 DIAGNOSIS — H353114 Nonexudative age-related macular degeneration, right eye, advanced atrophic with subfoveal involvement: Secondary | ICD-10-CM | POA: Diagnosis not present

## 2023-06-07 DIAGNOSIS — H43393 Other vitreous opacities, bilateral: Secondary | ICD-10-CM | POA: Diagnosis not present

## 2023-08-02 DIAGNOSIS — H35371 Puckering of macula, right eye: Secondary | ICD-10-CM | POA: Diagnosis not present

## 2023-08-02 DIAGNOSIS — H43813 Vitreous degeneration, bilateral: Secondary | ICD-10-CM | POA: Diagnosis not present

## 2023-08-02 DIAGNOSIS — H43393 Other vitreous opacities, bilateral: Secondary | ICD-10-CM | POA: Diagnosis not present

## 2023-08-02 DIAGNOSIS — H353114 Nonexudative age-related macular degeneration, right eye, advanced atrophic with subfoveal involvement: Secondary | ICD-10-CM | POA: Diagnosis not present

## 2023-08-02 DIAGNOSIS — H353134 Nonexudative age-related macular degeneration, bilateral, advanced atrophic with subfoveal involvement: Secondary | ICD-10-CM | POA: Diagnosis not present

## 2023-08-02 DIAGNOSIS — H35033 Hypertensive retinopathy, bilateral: Secondary | ICD-10-CM | POA: Diagnosis not present

## 2023-09-12 DIAGNOSIS — Z23 Encounter for immunization: Secondary | ICD-10-CM | POA: Diagnosis not present

## 2023-09-27 DIAGNOSIS — H353114 Nonexudative age-related macular degeneration, right eye, advanced atrophic with subfoveal involvement: Secondary | ICD-10-CM | POA: Diagnosis not present

## 2023-11-26 DIAGNOSIS — H43393 Other vitreous opacities, bilateral: Secondary | ICD-10-CM | POA: Diagnosis not present

## 2023-11-26 DIAGNOSIS — H353134 Nonexudative age-related macular degeneration, bilateral, advanced atrophic with subfoveal involvement: Secondary | ICD-10-CM | POA: Diagnosis not present

## 2023-11-26 DIAGNOSIS — H35371 Puckering of macula, right eye: Secondary | ICD-10-CM | POA: Diagnosis not present

## 2023-11-26 DIAGNOSIS — H353114 Nonexudative age-related macular degeneration, right eye, advanced atrophic with subfoveal involvement: Secondary | ICD-10-CM | POA: Diagnosis not present

## 2023-11-26 DIAGNOSIS — H43813 Vitreous degeneration, bilateral: Secondary | ICD-10-CM | POA: Diagnosis not present

## 2023-11-26 DIAGNOSIS — H35033 Hypertensive retinopathy, bilateral: Secondary | ICD-10-CM | POA: Diagnosis not present

## 2023-12-02 ENCOUNTER — Other Ambulatory Visit: Payer: Self-pay | Admitting: Internal Medicine

## 2024-01-21 DIAGNOSIS — H35371 Puckering of macula, right eye: Secondary | ICD-10-CM | POA: Diagnosis not present

## 2024-01-21 DIAGNOSIS — H353134 Nonexudative age-related macular degeneration, bilateral, advanced atrophic with subfoveal involvement: Secondary | ICD-10-CM | POA: Diagnosis not present

## 2024-01-21 DIAGNOSIS — H353114 Nonexudative age-related macular degeneration, right eye, advanced atrophic with subfoveal involvement: Secondary | ICD-10-CM | POA: Diagnosis not present

## 2024-01-21 DIAGNOSIS — H35033 Hypertensive retinopathy, bilateral: Secondary | ICD-10-CM | POA: Diagnosis not present

## 2024-01-21 DIAGNOSIS — H43393 Other vitreous opacities, bilateral: Secondary | ICD-10-CM | POA: Diagnosis not present

## 2024-01-21 DIAGNOSIS — H43813 Vitreous degeneration, bilateral: Secondary | ICD-10-CM | POA: Diagnosis not present

## 2024-01-26 DIAGNOSIS — J988 Other specified respiratory disorders: Secondary | ICD-10-CM | POA: Diagnosis not present

## 2024-02-23 NOTE — Progress Notes (Unsigned)
 Alyssa Petty, female    DOB: [birth year],   MRN: [id number]   Brief patient profile:  11 yowf quit smoking 2004/MS phenotype  with doe x hiking with pfts dx as "emphysema" and complicated by by post op hypoxemia 2010 but never treated longterm with 02 and placed on  spiriva and then anoro x 2017 and able to walk up to 5 miles a day [place] but avoids hills- concerned about desats when visits fm in [place] with elevations up to 4k feet so referred to pulmonary clinic 01/21/2019 by Dr   Docia Chuck     History of Present Illness  01/21/2019  Pulmonary/ 1st office eval/Angeleen Horney  Chief Complaint  Patient presents with   Pulmonary Consult    Referred by Dr Docia Chuck. Pt states traveled to [place] last year and had SOB and decreased o2 sats. She is concerned bc she plans to visit there again soon. She denies any respiratory co's today.   Dyspnea:  Fixed = MMRC1 = can walk nl pace, flat grade, can't hurry or go uphills or steps s sob   Cough: immediately on lying down year round   Sleep: bed is flat SABA use: can def tell it helps and does worse with activity if misses a dose  rec Plan A = Automatic = Stiolto 2pffs each am  Work on inhaler technique:    Plan B = Backup Only use your albuterol inhaler as a rescue medication  Please schedule a follow up office visit in 2 weeks, sooner if needed with pfts on return but use your plan A first       03/01/2023  f/u ov/Macenzie Burford re: GOLD 1 copd    maint on Stiolto 2 puffs daily   Chief Complaint  Patient presents with   Follow-up    COVID December 2023.  Cough, patient feels due to allergies/pollen season.  Dyspnea:  30 min walking nbhood  Cough: some with pollen  Sleeping: bed is flat/ one pillow  SABA use: sometimes with walking  02: none  Rec Generic dymista one twice daily as needed for nasal symptoms in spring  We will walk you today to see if you qualify for portable 02      02/25/2024 ext  f/u ov/Mattea Seger re: GOLD 1 COPD ? Need for portable  02    maint on stiolto  s/p aecopd pred/ doxy per UC Feb 14 - last one ? Over a year prior to OV  - back to baseline at time of ov  Chief Complaint  Patient presents with   Follow-up   Dyspnea:  chair yoga  / occ treadmill x 20-30 m s stopping up to 3 x weekly   at 2.9 /  2% grade not checking sats  Cough: improved, non productive, sometimes cough x 15 min  Sleeping: bed blocks/ 2 pillows once asleeping s resp cc  SABA use: rarely needed  02: none  -  has had trouble traveling to [place] in past due to altitudes up to 5 k ft      No obvious day to day or daytime variability or assoc excess/ purulent sputum or mucus plugs or hemoptysis or cp or chest tightness, subjective wheeze or overt sinus or hb symptoms.    Also denies any obvious fluctuation of symptoms with weather or environmental changes or other aggravating or alleviating factors except as outlined above   No unusual exposure hx or h/o childhood pna/ asthma or knowledge of premature birth.  Current Allergies, Complete Past Medical History, Past Surgical History, Family History, and Social History were reviewed in [employer] record.  ROS  The following are not active complaints unless bolded Hoarseness, sore throat, dysphagia, dental problems, itching, sneezing,  nasal congestion or discharge of excess mucus or purulent secretions, ear ache,   fever, chills, sweats, unintended wt loss or wt gain, classically pleuritic or exertional cp,  orthopnea pnd or arm/hand swelling  or leg swelling, presyncope, palpitations, abdominal pain, anorexia, nausea, vomiting, diarrhea  or change in bowel habits or change in bladder habits, change in stools or change in urine, dysuria, hematuria,  rash, arthralgias, visual complaints, headache, numbness, weakness or ataxia or problems with walking or coordination,  change in mood or  memory.        Current Meds  Medication Sig   albuterol (VENTOLIN HFA) 108 (90 Base) MCG/ACT  inhaler Inhale 1-2 puffs into the lungs every 4 (four) hours as needed.   Multiple Vitamins-Minerals (PRESERVISION AREDS 2+MULTI VIT PO) Take 1 tablet by mouth daily.   rosuvastatin (CRESTOR) 5 MG tablet Take 5 mg by mouth daily.   Tiotropium Bromide-Olodaterol (STIOLTO RESPIMAT) 2.5-2.5 MCG/ACT AERS INHALE 2 PUFFS BY MOUTH ONCE DAILY   VITAMIN D PO Take 1 tablet by mouth 3 (three) times a week.              Objective:    Wt  02/25/2024        161 03/01/2023        162  02/08/2022          164   02/08/2021          164 02/09/2020          163   08/14/19 157 lb (71.2 kg)  02/05/19 161 lb (73 kg)  01/21/19 162 lb (73.5 kg)   Vital signs reviewed  02/25/2024  - Note on arrival  02 sats  87% on  RA improved to 91% p 5 min rest  General appearance:    pleasant amb wf / dry cough on voluntary maneuver   HEENT : Oropharynx  clear   Nasal turbinates mild non-specific edema   NECK :  without  apparent JVD/ palpable Nodes/TM    LUNGS: no acc muscle use,  Min barrel  contour chest wall with bilateral  slightly decreased bs s audible wheeze and  without cough on insp or exp maneuvers and min  Hyperresonant  to  percussion bilaterally    CV:  RRR  no s3 or murmur or increase in P2, and no edema   ABD:  soft and nontender with pos end  insp Hoover's  in the supine position.  No bruits or organomegaly appreciated   MS:  Nl gait/ ext warm without deformities Or obvious joint restrictions  calf tenderness, cyanosis or clubbing     SKIN: warm and dry without lesions    NEURO:  alert, approp, nl sensorium with  no motor or cerebellar deficits apparent.           CXR PA and Lateral:   02/25/2024 :    I personally reviewed images and impression is as follows:     Mild copd/ slt hyperlucent upper zones and some crowding or markings  in bases but no acute change vs priors  Labs ordered/ reviewed:      Chemistry      Component Value Date/Time   NA 140 02/25/2024 0946   K 3.9 02/25/2024 0946  CL 106 02/25/2024 0946   CO2 22 02/25/2024 0946   BUN 17 02/25/2024 0946   CREATININE 1.01 02/25/2024 0946      Component Value Date/Time   CALCIUM 9.2 02/25/2024 0946        Lab Results  Component Value Date   WBC 6.0 02/25/2024   HGB 15.6 (H) 02/25/2024   HCT 46.3 (H) 02/25/2024   MCV 95.3 02/25/2024   PLT 198.0 02/25/2024       EOS                                                               0.2                                 02/25/2024   Lab Results  Component Value Date   DDIMER 0.44 02/25/2024             Assessment

## 2024-02-25 ENCOUNTER — Encounter: Payer: Self-pay | Admitting: Internal Medicine

## 2024-02-25 ENCOUNTER — Ambulatory Visit

## 2024-02-25 ENCOUNTER — Ambulatory Visit: Payer: Medicare Other | Admitting: Internal Medicine

## 2024-02-25 VITALS — BP 140/80 | HR 88 | Temp 98.1°F | Ht 63.0 in | Wt 161.4 lb

## 2024-02-25 DIAGNOSIS — R0609 Other forms of dyspnea: Secondary | ICD-10-CM | POA: Diagnosis not present

## 2024-02-25 DIAGNOSIS — R918 Other nonspecific abnormal finding of lung field: Secondary | ICD-10-CM | POA: Diagnosis not present

## 2024-02-25 DIAGNOSIS — J449 Chronic obstructive pulmonary disease, unspecified: Secondary | ICD-10-CM | POA: Diagnosis not present

## 2024-02-25 DIAGNOSIS — R059 Cough, unspecified: Secondary | ICD-10-CM | POA: Diagnosis not present

## 2024-02-25 DIAGNOSIS — J9611 Chronic respiratory failure with hypoxia: Secondary | ICD-10-CM

## 2024-02-25 LAB — CBC WITH DIFFERENTIAL/PLATELET
Basophils Absolute: 0.1 10*3/uL (ref 0.0–0.1)
Basophils Relative: 1.1 % (ref 0.0–3.0)
Eosinophils Absolute: 0.2 10*3/uL (ref 0.0–0.7)
Eosinophils Relative: 3.8 % (ref 0.0–5.0)
HCT: 46.3 % — ABNORMAL HIGH (ref 36.0–46.0)
Hemoglobin: 15.6 g/dL — ABNORMAL HIGH (ref 12.0–15.0)
Lymphocytes Relative: 34.5 % (ref 12.0–46.0)
Lymphs Abs: 2.1 10*3/uL (ref 0.7–4.0)
MCHC: 33.7 g/dL (ref 30.0–36.0)
MCV: 95.3 fl (ref 78.0–100.0)
Monocytes Absolute: 0.6 10*3/uL (ref 0.1–1.0)
Monocytes Relative: 10.7 % (ref 3.0–12.0)
Neutro Abs: 3 10*3/uL (ref 1.4–7.7)
Neutrophils Relative %: 49.9 % (ref 43.0–77.0)
Platelets: 198 10*3/uL (ref 150.0–400.0)
RBC: 4.86 Mil/uL (ref 3.87–5.11)
RDW: 12.3 % (ref 11.5–15.5)
WBC: 6 10*3/uL (ref 4.0–10.5)

## 2024-02-25 LAB — BASIC METABOLIC PANEL
BUN: 17 mg/dL (ref 6–23)
CO2: 22 meq/L (ref 19–32)
Calcium: 9.2 mg/dL (ref 8.4–10.5)
Chloride: 106 meq/L (ref 96–112)
Creatinine, Ser: 1.01 mg/dL (ref 0.40–1.20)
GFR: 54.04 mL/min — ABNORMAL LOW (ref >60.00)
Glucose, Bld: 101 mg/dL — ABNORMAL HIGH (ref 70–99)
Potassium: 3.9 meq/L (ref 3.5–5.1)
Sodium: 140 meq/L (ref 135–145)

## 2024-02-25 NOTE — Assessment & Plan Note (Signed)
 Quit smoking 2004 - LDSCT  09/16/18 c/w emphysema  - alpha one AT screen 01/21/2019  MS  Level 131  - 01/21/2019    changed to stioilto trial basis due to dry cough and desats at altitudes and with ex  -  PFT's  02/05/2019  FEV1 1.85 (86 % ) ratio 0.67  p 3 % improvement from saba p anoro prior to study with DLCO  56/53 % corrects to 61  % for alv volume  With mod curvatre - 02/05/2019   Walked RA  2 laps @  approx 218ft each @ fast pace  stopped due to  End of study, desats to 84 s sob   - 02/05/2019  After extensive coaching inhaler device,  effectiveness =    90% with hfa so try bevespi x one month (insurance limits to one month )  then symb 160 sample  x 2 weeks and regroup@  6 weeks with formulary  03/24/2019 E-vist - Currently on Symbicort 80 which she states is better than bevespi and Anoro but not as good as Stiolto. Trial Spiriva respimat   > preferred stiolto   - 02/09/2020  After extensive coaching inhaler device,  effectiveness =    90% SMI > continue stiolto  - 03/01/2023   Walked on RA  x  3  lap(s) =  approx 750  ft  @ brisk pace, stopped due to end of study s sob  with lowest 02 sats 92%   >>> 02/25/2024 placed on amb 02 > see ex hypoxemia   >>>  02/25/2024  After extensive coaching inhaler device,  effectiveness =    60% (Ti too short) > consider bretztri if increase in exac 's > advised

## 2024-02-25 NOTE — Assessment & Plan Note (Addendum)
 Onset noted - post op 2010 and with trips to [place] since -   Walked RA  2 laps @  approx 230ft each @ fast pace  stopped due to  End of study, desats to 84 s sob while on Lama/laba maint rx  - 08/14/2019   Walked RA  3 laps @  approx 223ft each @ mod pace  stopped due to  End of study, sats till 95%   - 02/25/2024 Room Air at Rest = 90%  >>>   Room Air while Ambulating = 88% then on 2 Liters of pulsed oxygen and 2L of continuous oxygen while Ambulating = 93%  Rec Restart portable 02 Make sure you check your oxygen saturation  AT  your highest level of activity (not after you stop) or altitude (warned cabin pressure 8k on commercial flightsand 5 k in [place])    to be sure it stays over 90% and adjust  02 flow upward to maintain this level if needed but remember to turn it back to previous settings when you stop (to conserve your supply).   F/u q 3 months - sooner if needed

## 2024-02-25 NOTE — Patient Instructions (Addendum)
 Work on inhaler technique:  relax and gently blow all the way out then take a nice smooth full deep breath back in, triggering the inhaler at same time you start breathing in.  Hold breath in for at least  5 seconds if you can.    If mouth or throat bother you at all,  try brushing teeth/gums/tongue with arm and hammer toothpaste/ make a slurry and gargle and spit out.      Markus Daft would be a better option if you continue to have flares    Make sure you check your oxygen saturation  AT  your highest level of activity (not after you stop)   to be sure it stays over 90% and adjust  02 flow upward to maintain this level if needed but remember to turn it back to previous settings when you stop (to conserve your supply).   Please remember to go to the  x-ray department  for your tests - we will call you with the results when they are available    Please remember to go to the lab department   for your tests - we will call you with the results when they are available.      Please schedule a follow up visit in 3 months but call sooner if needed

## 2024-02-26 LAB — D-DIMER, QUANTITATIVE: D-Dimer, Quant: 0.44 ug{FEU}/mL (ref <0.50)

## 2024-02-26 LAB — IGE: IgE (Immunoglobulin E), Serum: 22 kU/L (ref <=114)

## 2024-02-26 NOTE — Assessment & Plan Note (Signed)
 Onset aecodp 01/25/24   >>> Doe labs/ cxr unremarkable > rx copd  with prn 02 and f/u 3 months     Each maintenance medication was reviewed in detail including emphasizing most importantly the difference between maintenance and prns and under what circumstances the prns are to be triggered using an action plan format where appropriate.  Total time for H and P, chart review, counseling, reviewing smi/hfa  device(s) , directly observing portions of ambulatory 02 saturation study/ and generating customized AVS unique to this office visit / same day charting = 35 min

## 2024-02-27 ENCOUNTER — Encounter: Payer: Self-pay | Admitting: *Deleted

## 2024-03-04 DIAGNOSIS — Z23 Encounter for immunization: Secondary | ICD-10-CM | POA: Diagnosis not present

## 2024-03-04 DIAGNOSIS — E7800 Pure hypercholesterolemia, unspecified: Secondary | ICD-10-CM | POA: Diagnosis not present

## 2024-03-04 DIAGNOSIS — Z79899 Other long term (current) drug therapy: Secondary | ICD-10-CM | POA: Diagnosis not present

## 2024-03-04 DIAGNOSIS — Z1331 Encounter for screening for depression: Secondary | ICD-10-CM | POA: Diagnosis not present

## 2024-03-04 DIAGNOSIS — H353134 Nonexudative age-related macular degeneration, bilateral, advanced atrophic with subfoveal involvement: Secondary | ICD-10-CM | POA: Diagnosis not present

## 2024-03-04 DIAGNOSIS — J439 Emphysema, unspecified: Secondary | ICD-10-CM | POA: Diagnosis not present

## 2024-03-04 DIAGNOSIS — E559 Vitamin D deficiency, unspecified: Secondary | ICD-10-CM | POA: Diagnosis not present

## 2024-03-04 DIAGNOSIS — Z0001 Encounter for general adult medical examination with abnormal findings: Secondary | ICD-10-CM | POA: Diagnosis not present

## 2024-03-04 DIAGNOSIS — I700 Atherosclerosis of aorta: Secondary | ICD-10-CM | POA: Diagnosis not present

## 2024-03-11 ENCOUNTER — Other Ambulatory Visit: Payer: Self-pay

## 2024-03-17 DIAGNOSIS — H353114 Nonexudative age-related macular degeneration, right eye, advanced atrophic with subfoveal involvement: Secondary | ICD-10-CM | POA: Diagnosis not present

## 2024-05-15 DIAGNOSIS — H43813 Vitreous degeneration, bilateral: Secondary | ICD-10-CM | POA: Diagnosis not present

## 2024-05-15 DIAGNOSIS — H35033 Hypertensive retinopathy, bilateral: Secondary | ICD-10-CM | POA: Diagnosis not present

## 2024-05-15 DIAGNOSIS — H353134 Nonexudative age-related macular degeneration, bilateral, advanced atrophic with subfoveal involvement: Secondary | ICD-10-CM | POA: Diagnosis not present

## 2024-05-15 DIAGNOSIS — H353114 Nonexudative age-related macular degeneration, right eye, advanced atrophic with subfoveal involvement: Secondary | ICD-10-CM | POA: Diagnosis not present

## 2024-05-15 DIAGNOSIS — H35371 Puckering of macula, right eye: Secondary | ICD-10-CM | POA: Diagnosis not present

## 2024-05-15 DIAGNOSIS — H43393 Other vitreous opacities, bilateral: Secondary | ICD-10-CM | POA: Diagnosis not present

## 2024-05-30 ENCOUNTER — Other Ambulatory Visit: Payer: Self-pay | Admitting: Internal Medicine

## 2024-07-10 DIAGNOSIS — H353114 Nonexudative age-related macular degeneration, right eye, advanced atrophic with subfoveal involvement: Secondary | ICD-10-CM | POA: Diagnosis not present

## 2024-07-13 NOTE — Progress Notes (Unsigned)
 Alyssa Petty, female    DOB: 10-10-1946,   MRN: [id number]   Brief patient profile:  89 yowf quit smoking 2004/MS phenotype  with doe x hiking with pfts dx as emphysema and complicated by by post op hypoxemia 2010 but never treated longterm with 02 and placed on  spiriva  and then anoro x 2017 and able to walk up to 5 miles a day thru [place] but avoids hills- concerned about desats when visits fm in [place]  with elevations up to 4k feet Petty referred to pulmonary clinic 01/21/2019 by Dr   Regino     History of Present Illness  01/21/2019  Pulmonary/ 1st office eval/Alyssa Petty  Chief Complaint  Patient presents with   Pulmonary Consult    Referred by Dr Regino. Pt states traveled to [place]  last year and had SOB and decreased o2 sats. She is concerned bc she plans to visit there again soon. She denies any respiratory co's today.   Dyspnea:  Fixed = MMRC1 = can walk nl pace, flat grade, can't hurry or go uphills or steps s sob   Cough: immediately on lying down year round   Sleep: bed is flat SABA use: can def tell it helps and does worse with activity if misses a dose  rec Plan A = Automatic = Stiolto 2pffs each am  Work on inhaler technique:    Plan B = Backup Only use your albuterol  inhaler as a rescue medication  Please schedule a follow up office visit in 2 weeks, sooner if needed with pfts on return but use your plan A first       03/01/2023  f/u ov/Alyssa Petty re: GOLD 1 copd    maint on Stiolto 2 puffs daily   Chief Complaint  Patient presents with   Follow-up    COVID December 2023.  Cough, patient feels due to allergies/pollen season.  Dyspnea:  30 min walking [place]  Cough: some with pollen  Sleeping: bed is flat/ one pillow  SABA use: sometimes with walking  02: none  Rec Generic dymista  one twice daily as needed for nasal symptoms in spring  We will walk you today to see if you qualify for portable 02      02/25/2024 ext  f/u ov/Alyssa Petty re: GOLD 1 COPD ? Need for portable  02    maint on stiolto  s/p aecopd pred/ doxy per [facility] Feb 14 - last one ? Over a year prior to OV  - back to baseline at time of ov  Chief Complaint  Patient presents with   Follow-up   Dyspnea:  chair yoga  / occ treadmill x 20-30 m s stopping up to 3 x weekly   at 2.9 /  2% grade not checking sats  Cough: improved, non productive, sometimes cough x 15 min  Sleeping: bed blocks/ 2 pillows once asleeping s resp cc  SABA use: rarely needed  02: none  -  has had trouble traveling to [place]  in past due to altitudes up to 5 k ft  Rec Work on inhaler technique:    Breztri would be a better option if you continue to have flares  Make sure you check your oxygen  saturation  AT  your highest level of activity (not after you stop)   to be sure it stays over 90% Please remember to go to the  x-ray department  for your tests - we will call you with the results when they are available    -  Allergy screen   02/25/24 >  Eos 0.2 /  IgE  22      07/17/2024  f/u ov/Alyssa Petty re: GOLD 1 COPD    maint on stiolto   Chief Complaint  Patient presents with   Follow-up    69mo f/u Pt has no concerns.    Dyspnea:  outdoor walking up some hills x 30-45 min  Cough: no Sleeping: bed blocks / 2 pillows  resp cc  SABA use: none  02: conc 2lpm as needed/ POC at 2lpm       No obvious day to day or daytime variability or assoc excess/ purulent sputum or mucus plugs or hemoptysis or cp or chest tightness, subjective wheeze or overt sinus or hb symptoms.    Also denies any obvious fluctuation of symptoms with weather or environmental changes or other aggravating or alleviating factors except as outlined above   No unusual exposure hx or h/o childhood pna/ asthma or knowledge of premature birth.  Current Allergies, Complete Past Medical History, Past Surgical History, Family History, and Social History were reviewed in [employer] record.  ROS  The following are not active complaints unless  bolded Hoarseness, sore throat, dysphagia, dental problems, itching, sneezing,  nasal congestion or discharge of excess mucus or purulent secretions, ear ache,   fever, chills, sweats, unintended wt loss or wt gain, classically pleuritic or exertional cp,  orthopnea pnd or arm/hand swelling  or leg swelling, presyncope, palpitations, abdominal pain, anorexia, nausea, vomiting, diarrhea  or change in bowel habits or change in bladder habits, change in stools or change in urine, dysuria, hematuria,  rash, arthralgias, visual complaints, headache, numbness, weakness or ataxia or problems with walking or coordination,  change in mood or  memory.        Current Meds  Medication Sig   albuterol  (VENTOLIN  HFA) 108 (90 Base) MCG/ACT inhaler Inhale 1-2 puffs into the lungs every 4 (four) hours as needed.   Multiple Vitamins-Minerals (PRESERVISION AREDS 2+MULTI VIT PO) Take 1 tablet by mouth daily.   rosuvastatin (CRESTOR) 5 MG tablet Take 5 mg by mouth daily.   Tiotropium Bromide -Olodaterol (STIOLTO RESPIMAT ) 2.5-2.5 MCG/ACT AERS INHALE 2 PUFFS BY MOUTH ONCE DAILY   VITAMIN D PO Take 1 tablet by mouth 3 (three) times a week.                Objective:    Wt  07/17/2024          160  02/25/2024        161 03/01/2023        162  02/08/2022          164   02/08/2021          164 02/09/2020          163   08/14/19 157 lb (71.2 kg)  02/05/19 161 lb (73 kg)  01/21/19 162 lb (73.5 kg)    Vital signs reviewed  07/17/2024  - Note at rest 02 sats  93% on RA   General appearance:    pleasant amb wf nad   HEENT : Oropharynx  clear   Nasal turbinates  nl    NECK :  without  apparent JVD/ palpable Nodes/TM    LUNGS: no acc muscle use,  Min barrel  contour chest wall with bilateral  slightly decreased bs s audible wheeze and  without cough on insp or exp maneuvers and min  Hyperresonant  to  percussion bilaterally  CV:  RRR  no s3 or murmur or increase in P2, and no edema   ABD:  soft and nontender with  pos end  insp Hoover's  in the supine position.  No bruits or organomegaly appreciated   MS:  Nl gait/ ext warm without deformities Or obvious joint restrictions  calf tenderness, cyanosis or clubbing     SKIN: warm and dry without lesions    NEURO:  alert, approp, nl sensorium with  no motor or cerebellar deficits apparent.            Assessment     Assessment & Plan COPD GOLD 1  / emphysemic phenotype  Quit smoking 2004/MS - LDSCT  09/16/18 c/w emphysema  - alpha one AT screen 01/21/2019  MS  Level 131  - 01/21/2019    changed to stioilto trial basis due to dry cough and desats at altitudes and with ex  -  PFT's  02/05/2019  FEV1 1.85 (86 % ) ratio 0.67  p 3 % improvement from saba p anoro prior to study with DLCO  56/53 % corrects to 61  % for alv volume  With mod curvatre - 02/05/2019   Walked RA  2 laps @  approx 292ft each @ fast pace  stopped due to  End of study, desats to 84 s sob   - 02/05/2019  After extensive coaching inhaler device,  effectiveness =    90% with hfa Petty try bevespi  x one month (insurance limits to one month )  then symb 160 sample  x 2 weeks and regroup@  6 weeks with formulary  03/24/2019 E-vist - Currently on Symbicort  80 which she states is better than bevespi  and Anoro but not as good as Stiolto. Trial Spiriva  respimat   > preferred stiolto   - 02/09/2020  After extensive coaching inhaler device,  effectiveness =    90% SMI > continue stiolto  - 03/01/2023   Walked on RA  x  3  lap(s) =  approx 750  ft  @ brisk pace, stopped due to end of study s sob  with lowest 02 sats 92%  - 02/25/2024 placed on amb 02 > see ex hypoxemia  - 02/25/2024  After extensive coaching inhaler device,  effectiveness =    60% (Ti too short) > consider bretztri if increase in exac 's > continue stiolto   Pt is Group B in terms of symptom/risk and laba/lama therefore appropriate rx at this point >>>  stiolto  - The proper method of use, as well as anticipated side effects, of a  metered-dose (respimat)  inhaler were discussed and demonstrated to the patient using teach back method    Chronic respiratory failure with hypoxia (HCC) Onset noted - post op 2010 and with trips to [place]  since -   Walked RA  2 laps @  approx 221ft each @ fast pace  stopped due to  End of study, desats to 84 s sob while on Lama/laba maint rx  - 08/14/2019   Walked RA  3 laps @  approx 255ft each @ mod pace  stopped due to  End of study, sats till 95%   - 02/25/2024 Room Air at Rest = 90%  >>>   Room Air while Ambulating = 88% then on 2 Liters of pulsed oxygen  and 2L of continuous oxygen  while Ambulating = 93%  Again advised: Make sure you check your oxygen  saturation  AT  your highest level of activity (not after  you stop)   to be sure it stays over 90% and adjust  02 flow upward to maintain this level if needed but remember to turn it back to previous settings when you stop (to conserve your supply).   F/u yearly          Each maintenance medication was reviewed in detail including emphasizing most importantly the difference between maintenance and prns and under what circumstances the prns are to be triggered using an action plan format where appropriate.  Total time for H and P, chart review, counseling, reviewing  02/smi/ 02 pulse  device(s) and generating customized AVS unique to this office visit / same day charting = 22 min

## 2024-07-17 ENCOUNTER — Ambulatory Visit (INDEPENDENT_AMBULATORY_CARE_PROVIDER_SITE_OTHER): Admitting: Internal Medicine

## 2024-07-17 ENCOUNTER — Encounter: Payer: Self-pay | Admitting: Internal Medicine

## 2024-07-17 VITALS — BP 118/82 | HR 87 | Temp 97.5°F | Ht 63.0 in | Wt 160.8 lb

## 2024-07-17 DIAGNOSIS — Z87891 Personal history of nicotine dependence: Secondary | ICD-10-CM

## 2024-07-17 DIAGNOSIS — J9611 Chronic respiratory failure with hypoxia: Secondary | ICD-10-CM

## 2024-07-17 DIAGNOSIS — J449 Chronic obstructive pulmonary disease, unspecified: Secondary | ICD-10-CM

## 2024-07-17 NOTE — Patient Instructions (Signed)
 No change in medications   Work on inhaler technique:  relax and gently blow all the way out then take a nice smooth full deep breath back in, triggering the inhaler at same time you start breathing in.  Hold breath in for at least  5 seconds if you can.     Please schedule a follow up visit in 12  months but call sooner if needed

## 2024-07-17 NOTE — Assessment & Plan Note (Addendum)
 Quit smoking 2004/MS - LDSCT  09/16/18 c/w emphysema  - alpha one AT screen 01/21/2019  MS  Level 131  - 01/21/2019    changed to stioilto trial basis due to dry cough and desats at altitudes and with ex  -  PFT's  02/05/2019  FEV1 1.85 (86 % ) ratio 0.67  p 3 % improvement from saba p anoro prior to study with DLCO  56/53 % corrects to 61  % for alv volume  With mod curvatre - 02/05/2019   Walked RA  2 laps @  approx 246ft each @ fast pace  stopped due to  End of study, desats to 84 s sob   - 02/05/2019  After extensive coaching inhaler device,  effectiveness =    90% with hfa so try bevespi  x one month (insurance limits to one month )  then symb 160 sample  x 2 weeks and regroup@  6 weeks with formulary  03/24/2019 E-vist - Currently on Symbicort  80 which she states is better than bevespi  and Anoro but not as good as Stiolto. Trial Spiriva  respimat   > preferred stiolto   - 02/09/2020  After extensive coaching inhaler device,  effectiveness =    90% SMI > continue stiolto  - 03/01/2023   Walked on RA  x  3  lap(s) =  approx 750  ft  @ brisk pace, stopped due to end of study s sob  with lowest 02 sats 92%  - 02/25/2024 placed on amb 02 > see ex hypoxemia  - 02/25/2024  After extensive coaching inhaler device,  effectiveness =    60% (Ti too short) > consider bretztri if increase in exac 's > continue stiolto   Pt is Group B in terms of symptom/risk and laba/lama therefore appropriate rx at this point >>>  stiolto  - The proper method of use, as well as anticipated side effects, of a metered-dose (respimat)  inhaler were discussed and demonstrated to the patient using teach back method

## 2024-07-17 NOTE — Assessment & Plan Note (Addendum)
 Onset noted - post op 2010 and with trips to [place]  since -   Walked RA  2 laps @  approx 290ft each @ fast pace  stopped due to  End of study, desats to 84 s sob while on Lama/laba maint rx  - 08/14/2019   Walked RA  3 laps @  approx 265ft each @ mod pace  stopped due to  End of study, sats till 95%   - 02/25/2024 Room Air at Rest = 90%  >>>   Room Air while Ambulating = 88% then on 2 Liters of pulsed oxygen  and 2L of continuous oxygen  while Ambulating = 93%  Again advised: Make sure you check your oxygen  saturation  AT  your highest level of activity (not after you stop)   to be sure it stays over 90% and adjust  02 flow upward to maintain this level if needed but remember to turn it back to previous settings when you stop (to conserve your supply).   F/u yearly          Each maintenance medication was reviewed in detail including emphasizing most importantly the difference between maintenance and prns and under what circumstances the prns are to be triggered using an action plan format where appropriate.  Total time for H and P, chart review, counseling, reviewing  02/smi/ 02 pulse  device(s) and generating customized AVS unique to this office visit / same day charting = 22 min

## 2024-09-10 DIAGNOSIS — H43813 Vitreous degeneration, bilateral: Secondary | ICD-10-CM | POA: Diagnosis not present

## 2024-09-10 DIAGNOSIS — H43393 Other vitreous opacities, bilateral: Secondary | ICD-10-CM | POA: Diagnosis not present

## 2024-09-10 DIAGNOSIS — H35371 Puckering of macula, right eye: Secondary | ICD-10-CM | POA: Diagnosis not present

## 2024-09-10 DIAGNOSIS — H353114 Nonexudative age-related macular degeneration, right eye, advanced atrophic with subfoveal involvement: Secondary | ICD-10-CM | POA: Diagnosis not present

## 2024-09-10 DIAGNOSIS — H35033 Hypertensive retinopathy, bilateral: Secondary | ICD-10-CM | POA: Diagnosis not present

## 2024-09-10 DIAGNOSIS — H353134 Nonexudative age-related macular degeneration, bilateral, advanced atrophic with subfoveal involvement: Secondary | ICD-10-CM | POA: Diagnosis not present

## 2024-09-25 DIAGNOSIS — Z23 Encounter for immunization: Secondary | ICD-10-CM | POA: Diagnosis not present

## 2024-10-24 DIAGNOSIS — M25561 Pain in right knee: Secondary | ICD-10-CM | POA: Diagnosis not present

## 2024-10-30 ENCOUNTER — Telehealth: Payer: Self-pay | Admitting: *Deleted

## 2024-10-30 NOTE — Telephone Encounter (Signed)
 Copied from CRM [id number]. Topic: Clinical - Medication Question >> Oct 29, 2024  2:36 PM Rozanna MATSU wrote: Reason for CRM: pt is trying to see if the STIOLTO RESPIMAT  and if this medicine is the same without RESPIMAT on the end of is the same medicine, sh e making her Medicare part D covers this medicine.  I called and spoke with patient and let her know that sometimes we refer to the inhaler as just Stiolto and it is the same inhaler.  She verbalized understanding.  Nothing further needed.

## 2024-11-09 DIAGNOSIS — R21 Rash and other nonspecific skin eruption: Secondary | ICD-10-CM | POA: Diagnosis not present

## 2024-11-09 DIAGNOSIS — J029 Acute pharyngitis, unspecified: Secondary | ICD-10-CM | POA: Diagnosis not present

## 2024-11-09 DIAGNOSIS — H109 Unspecified conjunctivitis: Secondary | ICD-10-CM | POA: Diagnosis not present

## 2024-11-09 DIAGNOSIS — Z8709 Personal history of other diseases of the respiratory system: Secondary | ICD-10-CM | POA: Diagnosis not present

## 2024-11-09 DIAGNOSIS — R509 Fever, unspecified: Secondary | ICD-10-CM | POA: Diagnosis not present

## 2024-11-09 DIAGNOSIS — J209 Acute bronchitis, unspecified: Secondary | ICD-10-CM | POA: Diagnosis not present

## 2024-11-09 DIAGNOSIS — R051 Acute cough: Secondary | ICD-10-CM | POA: Diagnosis not present

## 2024-11-20 DIAGNOSIS — H353114 Nonexudative age-related macular degeneration, right eye, advanced atrophic with subfoveal involvement: Secondary | ICD-10-CM | POA: Diagnosis not present

## 2025-01-02 ENCOUNTER — Other Ambulatory Visit: Payer: Self-pay

## 2025-01-02 MED ORDER — STIOLTO RESPIMAT 2.5-2.5 MCG/ACT IN AERS: INHALATION_SPRAY | RESPIRATORY_TRACT | 11 refills | Status: AC
